# Patient Record
Sex: Male | Born: 1950
Health system: Southern US, Community
[De-identification: ages and names within clinical notes are randomized; demographics above are authoritative.]

---

## 2002-11-21 ENCOUNTER — Encounter: Payer: Self-pay | Admitting: Surgery

## 2002-11-21 ENCOUNTER — Encounter: Admission: RE | Admit: 2002-11-21 | Discharge: 2002-11-21 | Payer: Self-pay | Admitting: Surgery

## 2003-04-29 ENCOUNTER — Encounter: Admission: RE | Admit: 2003-04-29 | Discharge: 2003-04-29 | Payer: Self-pay | Admitting: Surgery

## 2009-07-14 ENCOUNTER — Encounter: Admission: RE | Admit: 2009-07-14 | Discharge: 2009-07-14 | Payer: Self-pay | Admitting: General Surgery

## 2017-01-20 DIAGNOSIS — M9903 Segmental and somatic dysfunction of lumbar region: Secondary | ICD-10-CM | POA: Diagnosis not present

## 2017-01-20 DIAGNOSIS — M9901 Segmental and somatic dysfunction of cervical region: Secondary | ICD-10-CM | POA: Diagnosis not present

## 2017-01-20 DIAGNOSIS — M542 Cervicalgia: Secondary | ICD-10-CM | POA: Diagnosis not present

## 2017-01-20 DIAGNOSIS — M546 Pain in thoracic spine: Secondary | ICD-10-CM | POA: Diagnosis not present

## 2017-01-24 DIAGNOSIS — M546 Pain in thoracic spine: Secondary | ICD-10-CM | POA: Diagnosis not present

## 2017-01-24 DIAGNOSIS — M542 Cervicalgia: Secondary | ICD-10-CM | POA: Diagnosis not present

## 2017-01-24 DIAGNOSIS — M9903 Segmental and somatic dysfunction of lumbar region: Secondary | ICD-10-CM | POA: Diagnosis not present

## 2017-01-24 DIAGNOSIS — M9901 Segmental and somatic dysfunction of cervical region: Secondary | ICD-10-CM | POA: Diagnosis not present

## 2017-04-01 DIAGNOSIS — E7800 Pure hypercholesterolemia, unspecified: Secondary | ICD-10-CM | POA: Diagnosis not present

## 2017-04-01 DIAGNOSIS — R7303 Prediabetes: Secondary | ICD-10-CM | POA: Diagnosis not present

## 2017-04-01 DIAGNOSIS — Z125 Encounter for screening for malignant neoplasm of prostate: Secondary | ICD-10-CM | POA: Diagnosis not present

## 2017-04-01 DIAGNOSIS — I10 Essential (primary) hypertension: Secondary | ICD-10-CM | POA: Diagnosis not present

## 2017-04-08 DIAGNOSIS — Z23 Encounter for immunization: Secondary | ICD-10-CM | POA: Diagnosis not present

## 2017-04-08 DIAGNOSIS — I10 Essential (primary) hypertension: Secondary | ICD-10-CM | POA: Diagnosis not present

## 2017-04-08 DIAGNOSIS — E782 Mixed hyperlipidemia: Secondary | ICD-10-CM | POA: Diagnosis not present

## 2017-04-08 DIAGNOSIS — Z0000 Encounter for general adult medical examination without abnormal findings: Secondary | ICD-10-CM | POA: Diagnosis not present

## 2017-05-12 DIAGNOSIS — D485 Neoplasm of uncertain behavior of skin: Secondary | ICD-10-CM | POA: Diagnosis not present

## 2017-05-12 DIAGNOSIS — D225 Melanocytic nevi of trunk: Secondary | ICD-10-CM | POA: Diagnosis not present

## 2017-05-12 DIAGNOSIS — Z85828 Personal history of other malignant neoplasm of skin: Secondary | ICD-10-CM | POA: Diagnosis not present

## 2017-05-12 DIAGNOSIS — L821 Other seborrheic keratosis: Secondary | ICD-10-CM | POA: Diagnosis not present

## 2017-08-04 DIAGNOSIS — E782 Mixed hyperlipidemia: Secondary | ICD-10-CM | POA: Diagnosis not present

## 2017-08-04 DIAGNOSIS — R7303 Prediabetes: Secondary | ICD-10-CM | POA: Diagnosis not present

## 2017-08-04 DIAGNOSIS — Z0000 Encounter for general adult medical examination without abnormal findings: Secondary | ICD-10-CM | POA: Diagnosis not present

## 2017-08-04 DIAGNOSIS — I10 Essential (primary) hypertension: Secondary | ICD-10-CM | POA: Diagnosis not present

## 2017-08-08 DIAGNOSIS — E782 Mixed hyperlipidemia: Secondary | ICD-10-CM | POA: Diagnosis not present

## 2017-08-08 DIAGNOSIS — E663 Overweight: Secondary | ICD-10-CM | POA: Diagnosis not present

## 2017-08-08 DIAGNOSIS — I10 Essential (primary) hypertension: Secondary | ICD-10-CM | POA: Diagnosis not present

## 2017-08-08 DIAGNOSIS — R7303 Prediabetes: Secondary | ICD-10-CM | POA: Diagnosis not present

## 2017-11-09 DIAGNOSIS — Z85828 Personal history of other malignant neoplasm of skin: Secondary | ICD-10-CM | POA: Diagnosis not present

## 2017-11-09 DIAGNOSIS — D225 Melanocytic nevi of trunk: Secondary | ICD-10-CM | POA: Diagnosis not present

## 2017-11-09 DIAGNOSIS — C44519 Basal cell carcinoma of skin of other part of trunk: Secondary | ICD-10-CM | POA: Diagnosis not present

## 2017-11-09 DIAGNOSIS — L720 Epidermal cyst: Secondary | ICD-10-CM | POA: Diagnosis not present

## 2017-11-09 DIAGNOSIS — D2262 Melanocytic nevi of left upper limb, including shoulder: Secondary | ICD-10-CM | POA: Diagnosis not present

## 2018-01-03 DIAGNOSIS — D72819 Decreased white blood cell count, unspecified: Secondary | ICD-10-CM | POA: Diagnosis not present

## 2018-01-03 DIAGNOSIS — R509 Fever, unspecified: Secondary | ICD-10-CM | POA: Diagnosis not present

## 2018-01-06 DIAGNOSIS — D709 Neutropenia, unspecified: Secondary | ICD-10-CM | POA: Diagnosis not present

## 2018-01-06 DIAGNOSIS — R509 Fever, unspecified: Secondary | ICD-10-CM | POA: Diagnosis not present

## 2018-03-02 DIAGNOSIS — Z23 Encounter for immunization: Secondary | ICD-10-CM | POA: Diagnosis not present

## 2018-03-26 DIAGNOSIS — R197 Diarrhea, unspecified: Secondary | ICD-10-CM | POA: Diagnosis not present

## 2018-03-27 DIAGNOSIS — R197 Diarrhea, unspecified: Secondary | ICD-10-CM | POA: Diagnosis not present

## 2018-04-13 DIAGNOSIS — R7303 Prediabetes: Secondary | ICD-10-CM | POA: Diagnosis not present

## 2018-04-13 DIAGNOSIS — Z8619 Personal history of other infectious and parasitic diseases: Secondary | ICD-10-CM | POA: Diagnosis not present

## 2018-04-13 DIAGNOSIS — Z0000 Encounter for general adult medical examination without abnormal findings: Secondary | ICD-10-CM | POA: Diagnosis not present

## 2018-04-13 DIAGNOSIS — I10 Essential (primary) hypertension: Secondary | ICD-10-CM | POA: Diagnosis not present

## 2018-05-23 DIAGNOSIS — Z85828 Personal history of other malignant neoplasm of skin: Secondary | ICD-10-CM | POA: Diagnosis not present

## 2018-05-23 DIAGNOSIS — C44519 Basal cell carcinoma of skin of other part of trunk: Secondary | ICD-10-CM | POA: Diagnosis not present

## 2018-05-23 DIAGNOSIS — D485 Neoplasm of uncertain behavior of skin: Secondary | ICD-10-CM | POA: Diagnosis not present

## 2018-05-23 DIAGNOSIS — C4441 Basal cell carcinoma of skin of scalp and neck: Secondary | ICD-10-CM | POA: Diagnosis not present

## 2018-05-23 DIAGNOSIS — D225 Melanocytic nevi of trunk: Secondary | ICD-10-CM | POA: Diagnosis not present

## 2018-05-23 DIAGNOSIS — L821 Other seborrheic keratosis: Secondary | ICD-10-CM | POA: Diagnosis not present

## 2018-11-23 DIAGNOSIS — L570 Actinic keratosis: Secondary | ICD-10-CM | POA: Diagnosis not present

## 2018-11-23 DIAGNOSIS — L821 Other seborrheic keratosis: Secondary | ICD-10-CM | POA: Diagnosis not present

## 2018-11-23 DIAGNOSIS — Z85828 Personal history of other malignant neoplasm of skin: Secondary | ICD-10-CM | POA: Diagnosis not present

## 2018-11-23 DIAGNOSIS — D225 Melanocytic nevi of trunk: Secondary | ICD-10-CM | POA: Diagnosis not present

## 2019-02-22 ENCOUNTER — Encounter: Payer: Self-pay | Admitting: Podiatry

## 2019-02-22 ENCOUNTER — Ambulatory Visit (INDEPENDENT_AMBULATORY_CARE_PROVIDER_SITE_OTHER): Payer: Medicare Other

## 2019-02-22 ENCOUNTER — Other Ambulatory Visit: Payer: Self-pay

## 2019-02-22 ENCOUNTER — Ambulatory Visit: Payer: Medicare Other | Admitting: Podiatry

## 2019-02-22 VITALS — BP 131/70 | HR 77 | Resp 16

## 2019-02-22 DIAGNOSIS — M7662 Achilles tendinitis, left leg: Secondary | ICD-10-CM

## 2019-02-22 DIAGNOSIS — M7661 Achilles tendinitis, right leg: Secondary | ICD-10-CM

## 2019-02-22 MED ORDER — METHYLPREDNISOLONE 4 MG PO TBPK: ORAL_TABLET | ORAL | 0 refills | Status: DC

## 2019-02-22 NOTE — Progress Notes (Signed)
  Subjective:  Patient ID: Johnny Edwards, male    DOB: 1950-08-06,  MRN: [id number] HPI Chief Complaint  Patient presents with  . Foot Pain    Posterior and plantar heel bilateral (L>R) - aching x 2 months, same problem 2 years ago, orthopedist told him to wear 2 pair socks to help, so he did-problem has returned, takes naproxen, tried "deep blue" and biofreeze  . New Patient (Initial Visit)    68 y.o. male presents with the above complaint.   ROS: Denies fever chills nausea vomiting muscle aches pains calf pain back pain chest pain shortness of breath  No past medical history on file.   Current Outpatient Medications:  .  amLODipine (NORVASC) 10 MG tablet, , Disp: , Rfl:  .  atorvastatin (LIPITOR) 20 MG tablet, , Disp: , Rfl:  .  methylPREDNISolone (MEDROL DOSEPAK) 4 MG TBPK tablet, 6 day dose pack - take as directed, Disp: 21 tablet, Rfl: 0  No Known Allergies Review of Systems Objective:   Vitals:   02/22/19 1514  BP: 131/70  Pulse: 77  Resp: 16    General: Well developed, nourished, in no acute distress, alert and oriented x3   Dermatological: Skin is warm, dry and supple bilateral. Nails x 10 are well maintained; remaining integument appears unremarkable at this time. There are no open sores, no preulcerative lesions, no rash or signs of infection present.  Vascular: Dorsalis Pedis artery and Posterior Tibial artery pedal pulses are 2/4 bilateral with immedate capillary fill time. Pedal hair growth present. No varicosities and no lower extremity edema present bilateral.   Neruologic: Grossly intact via light touch bilateral. Vibratory intact via tuning fork bilateral. Protective threshold with Semmes Wienstein monofilament intact to all pedal sites bilateral. Patellar and Achilles deep tendon reflexes 2+ bilateral. No Babinski or clonus noted bilateral.   Musculoskeletal: No gross boney pedal deformities bilateral. No pain, crepitus, or limitation noted with foot and  ankle range of motion bilateral. Muscular strength 5/5 in all groups tested bilateral.  Pain on palpation with fluctuance to the lateral aspect of the Achilles Achilles tendon at its insertion site of the calcaneus left greater than right.  There is mild tenderness on palpation of the Achilles itself.  Gait: Unassisted, Nonantalgic.    Radiographs:  Radiographs taken today demonstrate an osseously mature individual with thickening of the Achilles tendon distally.  Also demonstrates retrocalcaneal heel spur with intratendinous calcification.  Plantar distally oriented calcaneal heel spur left also demonstrates some intratendinous calcification with a plantar spur.  Assessment & Plan:   Assessment: At this point after sterile Betadine skin prep I injected 2 mg of dexamethasone and local anesthetic to the point of maximal tenderness injecting into a bursa on the superior lateral aspect of the calcaneus at the insertion site of the Achilles.    Plan: After the injections I will start him on methylprednisolone I will follow-up with him in 1 month.     Max T. [place], [place]

## 2019-04-17 ENCOUNTER — Ambulatory Visit: Payer: Medicare Other | Admitting: Podiatry

## 2019-04-19 DIAGNOSIS — I10 Essential (primary) hypertension: Secondary | ICD-10-CM | POA: Diagnosis not present

## 2019-04-19 DIAGNOSIS — R7303 Prediabetes: Secondary | ICD-10-CM | POA: Diagnosis not present

## 2019-04-19 DIAGNOSIS — E7800 Pure hypercholesterolemia, unspecified: Secondary | ICD-10-CM | POA: Diagnosis not present

## 2019-04-19 DIAGNOSIS — Z0000 Encounter for general adult medical examination without abnormal findings: Secondary | ICD-10-CM | POA: Diagnosis not present

## 2019-04-27 DIAGNOSIS — I10 Essential (primary) hypertension: Secondary | ICD-10-CM | POA: Diagnosis not present

## 2019-04-27 DIAGNOSIS — E7800 Pure hypercholesterolemia, unspecified: Secondary | ICD-10-CM | POA: Diagnosis not present

## 2019-04-27 DIAGNOSIS — R7303 Prediabetes: Secondary | ICD-10-CM | POA: Diagnosis not present

## 2019-04-27 DIAGNOSIS — Z0000 Encounter for general adult medical examination without abnormal findings: Secondary | ICD-10-CM | POA: Diagnosis not present

## 2019-05-01 ENCOUNTER — Ambulatory Visit: Payer: Medicare Other | Admitting: Podiatry

## 2019-05-01 ENCOUNTER — Other Ambulatory Visit: Payer: Self-pay

## 2019-05-01 ENCOUNTER — Encounter: Payer: Self-pay | Admitting: Podiatry

## 2019-05-01 DIAGNOSIS — M7662 Achilles tendinitis, left leg: Secondary | ICD-10-CM

## 2019-05-01 DIAGNOSIS — M7661 Achilles tendinitis, right leg: Secondary | ICD-10-CM

## 2019-05-01 MED ORDER — MELOXICAM 15 MG PO TABS: 15.0000 mg | ORAL_TABLET | Freq: Every day | ORAL | 3 refills | Status: DC

## 2019-05-01 NOTE — Progress Notes (Signed)
He presents today for follow-up of his bilateral Achilles tendinitis he states that he has had these treated in the past with injections to no avail.  He states that he does a lot of standing while at work.  He states that they are no worse and mildly better from September.  Objective: Vital signs are stable he is alert and oriented x3.  Insertional Achilles tendinitis with pain on palpation bilaterally he has mild bursitis which is a feeling of fluctuance beneath the skin of the posterior heel.  Appears to be a retro-Achilles bursitis.  Assessment: Achilles bursitis Achilles tendinitis bilaterally.  Plan: Discussed etiology pathology conservative versus surgical therapies at this point were going to request orthotics to be fabricated he saw Liliane Channel today for those.  Follow-up with him once those come in.

## 2019-05-22 ENCOUNTER — Other Ambulatory Visit: Payer: Medicare Other | Admitting: Orthotics

## 2019-05-28 ENCOUNTER — Other Ambulatory Visit: Payer: Self-pay

## 2019-05-28 ENCOUNTER — Ambulatory Visit: Payer: Medicare Other | Admitting: Orthotics

## 2019-05-28 DIAGNOSIS — M7661 Achilles tendinitis, right leg: Secondary | ICD-10-CM

## 2019-05-28 DIAGNOSIS — M7662 Achilles tendinitis, left leg: Secondary | ICD-10-CM

## 2019-05-28 NOTE — Progress Notes (Signed)
Patient came in today to pick up custom made foot orthotics.  The goals were accomplished and the patient reported no dissatisfaction with said orthotics.  Patient was advised of breakin period and how to report any issues. 

## 2019-05-29 DIAGNOSIS — Z85828 Personal history of other malignant neoplasm of skin: Secondary | ICD-10-CM | POA: Diagnosis not present

## 2019-05-29 DIAGNOSIS — D225 Melanocytic nevi of trunk: Secondary | ICD-10-CM | POA: Diagnosis not present

## 2019-05-29 DIAGNOSIS — D1801 Hemangioma of skin and subcutaneous tissue: Secondary | ICD-10-CM | POA: Diagnosis not present

## 2019-05-29 DIAGNOSIS — L570 Actinic keratosis: Secondary | ICD-10-CM | POA: Diagnosis not present

## 2019-06-14 DIAGNOSIS — I10 Essential (primary) hypertension: Secondary | ICD-10-CM | POA: Diagnosis not present

## 2019-06-14 DIAGNOSIS — R600 Localized edema: Secondary | ICD-10-CM | POA: Diagnosis not present

## 2019-06-28 ENCOUNTER — Other Ambulatory Visit: Payer: Self-pay

## 2019-06-28 ENCOUNTER — Ambulatory Visit: Payer: Medicare Other | Attending: Internal Medicine

## 2019-06-28 ENCOUNTER — Ambulatory Visit: Payer: Medicare Other | Admitting: Podiatry

## 2019-06-28 ENCOUNTER — Encounter: Payer: Self-pay | Admitting: Podiatry

## 2019-06-28 DIAGNOSIS — M7662 Achilles tendinitis, left leg: Secondary | ICD-10-CM

## 2019-06-28 DIAGNOSIS — M7661 Achilles tendinitis, right leg: Secondary | ICD-10-CM

## 2019-06-28 DIAGNOSIS — Z23 Encounter for immunization: Secondary | ICD-10-CM | POA: Insufficient documentation

## 2019-06-28 NOTE — Progress Notes (Signed)
   Covid-19 Vaccination Clinic  Name:  Johnny Edwards    MRN: [id number] DOB: [birth year]  06/28/2019  [name] was observed post Covid-19 immunization for 15 minutes without incidence. He was provided with Vaccine Information Sheet and instruction to access the V-Safe system.   [name] was instructed to call 911 with any severe reactions post vaccine: Marland Kitchen Difficulty breathing  . Swelling of your face and throat  . A fast heartbeat  . A bad rash all over your body  . Dizziness and weakness    Immunizations Administered    Name Date Dose VIS Date Route   Pfizer COVID-19 Vaccine 06/28/2019  6:10 PM 0.3 mL 05/18/2019 Intramuscular   Manufacturer: [employer], [employer]   Lot: [id number]   NDC: [address]-2

## 2019-06-28 NOTE — Progress Notes (Signed)
He presents today for follow-up of his Achilles tendinitis bilaterally states that been using the orthotics and they seem to be working but I still have this area right in here that is mildly tender.  Objective: Vital signs are stable he is alert and oriented x3.  Pulses are palpable.  Neurologic sensorium is intact.  He has pain on palpation distal medial aspect of the Achilles bilaterally with some soft tissue fluctuance consistent with bursitis.  Assessment: Distal Achilles tendinitis medially with bursitis.  Plan: Discussed etiology pathology conservative or surgical therapies at this point time injected 2 mg of dexamethasone and local anesthetic to the area of bursitis bilaterally.  Also recommend he continue the use of the orthotics.  Recommend an anti-inflammatory such as Voltaren gel.  We will follow-up with him in the near future.

## 2019-07-19 ENCOUNTER — Ambulatory Visit: Payer: Medicare Other | Admitting: Orthotics

## 2019-07-19 ENCOUNTER — Other Ambulatory Visit: Payer: Self-pay

## 2019-07-19 ENCOUNTER — Ambulatory Visit: Payer: Medicare Other | Attending: Internal Medicine

## 2019-07-19 DIAGNOSIS — Z23 Encounter for immunization: Secondary | ICD-10-CM | POA: Insufficient documentation

## 2019-07-19 DIAGNOSIS — M7661 Achilles tendinitis, right leg: Secondary | ICD-10-CM

## 2019-07-19 NOTE — Progress Notes (Signed)
   Covid-19 Vaccination Clinic  Name:  Johnny Edwards    MRN: [id number] DOB: 1951-01-04  07/19/2019  [name] was observed post Covid-19 immunization for 15 minutes without incidence. He was provided with Vaccine Information Sheet and instruction to access the V-Safe system.   [name] was instructed to call 911 with any severe reactions post vaccine: Marland Kitchen Difficulty breathing  . Swelling of your face and throat  . A fast heartbeat  . A bad rash all over your body  . Dizziness and weakness    Immunizations Administered    Name Date Dose VIS Date Route   Pfizer COVID-19 Vaccine 07/19/2019  5:18 PM 0.3 mL 05/18/2019 Intramuscular   Manufacturer: [employer], [employer]   Lot: [id number]   NDC: [address]-5

## 2019-07-19 NOTE — Progress Notes (Signed)
Adjusted f/o taking material out of medial heel.

## 2019-08-06 DIAGNOSIS — M9903 Segmental and somatic dysfunction of lumbar region: Secondary | ICD-10-CM | POA: Diagnosis not present

## 2019-08-06 DIAGNOSIS — M9901 Segmental and somatic dysfunction of cervical region: Secondary | ICD-10-CM | POA: Diagnosis not present

## 2019-08-06 DIAGNOSIS — M9904 Segmental and somatic dysfunction of sacral region: Secondary | ICD-10-CM | POA: Diagnosis not present

## 2019-08-06 DIAGNOSIS — M9902 Segmental and somatic dysfunction of thoracic region: Secondary | ICD-10-CM | POA: Diagnosis not present

## 2019-08-08 DIAGNOSIS — M9901 Segmental and somatic dysfunction of cervical region: Secondary | ICD-10-CM | POA: Diagnosis not present

## 2019-08-08 DIAGNOSIS — M9903 Segmental and somatic dysfunction of lumbar region: Secondary | ICD-10-CM | POA: Diagnosis not present

## 2019-08-08 DIAGNOSIS — M9904 Segmental and somatic dysfunction of sacral region: Secondary | ICD-10-CM | POA: Diagnosis not present

## 2019-08-08 DIAGNOSIS — M9902 Segmental and somatic dysfunction of thoracic region: Secondary | ICD-10-CM | POA: Diagnosis not present

## 2019-08-09 DIAGNOSIS — M9903 Segmental and somatic dysfunction of lumbar region: Secondary | ICD-10-CM | POA: Diagnosis not present

## 2019-08-09 DIAGNOSIS — M9904 Segmental and somatic dysfunction of sacral region: Secondary | ICD-10-CM | POA: Diagnosis not present

## 2019-08-09 DIAGNOSIS — M9901 Segmental and somatic dysfunction of cervical region: Secondary | ICD-10-CM | POA: Diagnosis not present

## 2019-08-09 DIAGNOSIS — M9902 Segmental and somatic dysfunction of thoracic region: Secondary | ICD-10-CM | POA: Diagnosis not present

## 2019-08-13 DIAGNOSIS — R350 Frequency of micturition: Secondary | ICD-10-CM | POA: Diagnosis not present

## 2019-08-14 ENCOUNTER — Ambulatory Visit: Payer: Medicare Other | Admitting: Podiatry

## 2019-08-16 DIAGNOSIS — M9903 Segmental and somatic dysfunction of lumbar region: Secondary | ICD-10-CM | POA: Diagnosis not present

## 2019-08-16 DIAGNOSIS — M9904 Segmental and somatic dysfunction of sacral region: Secondary | ICD-10-CM | POA: Diagnosis not present

## 2019-08-16 DIAGNOSIS — M9902 Segmental and somatic dysfunction of thoracic region: Secondary | ICD-10-CM | POA: Diagnosis not present

## 2019-08-16 DIAGNOSIS — M9901 Segmental and somatic dysfunction of cervical region: Secondary | ICD-10-CM | POA: Diagnosis not present

## 2019-08-20 DIAGNOSIS — M9902 Segmental and somatic dysfunction of thoracic region: Secondary | ICD-10-CM | POA: Diagnosis not present

## 2019-08-20 DIAGNOSIS — M9901 Segmental and somatic dysfunction of cervical region: Secondary | ICD-10-CM | POA: Diagnosis not present

## 2019-08-20 DIAGNOSIS — M9904 Segmental and somatic dysfunction of sacral region: Secondary | ICD-10-CM | POA: Diagnosis not present

## 2019-08-20 DIAGNOSIS — M9903 Segmental and somatic dysfunction of lumbar region: Secondary | ICD-10-CM | POA: Diagnosis not present

## 2019-08-23 DIAGNOSIS — M9902 Segmental and somatic dysfunction of thoracic region: Secondary | ICD-10-CM | POA: Diagnosis not present

## 2019-08-23 DIAGNOSIS — M9904 Segmental and somatic dysfunction of sacral region: Secondary | ICD-10-CM | POA: Diagnosis not present

## 2019-08-23 DIAGNOSIS — M9903 Segmental and somatic dysfunction of lumbar region: Secondary | ICD-10-CM | POA: Diagnosis not present

## 2019-08-23 DIAGNOSIS — M9901 Segmental and somatic dysfunction of cervical region: Secondary | ICD-10-CM | POA: Diagnosis not present

## 2019-08-27 DIAGNOSIS — M9904 Segmental and somatic dysfunction of sacral region: Secondary | ICD-10-CM | POA: Diagnosis not present

## 2019-08-27 DIAGNOSIS — M9903 Segmental and somatic dysfunction of lumbar region: Secondary | ICD-10-CM | POA: Diagnosis not present

## 2019-08-27 DIAGNOSIS — M9902 Segmental and somatic dysfunction of thoracic region: Secondary | ICD-10-CM | POA: Diagnosis not present

## 2019-08-27 DIAGNOSIS — M9901 Segmental and somatic dysfunction of cervical region: Secondary | ICD-10-CM | POA: Diagnosis not present

## 2019-08-30 DIAGNOSIS — M9901 Segmental and somatic dysfunction of cervical region: Secondary | ICD-10-CM | POA: Diagnosis not present

## 2019-08-30 DIAGNOSIS — M9902 Segmental and somatic dysfunction of thoracic region: Secondary | ICD-10-CM | POA: Diagnosis not present

## 2019-08-30 DIAGNOSIS — M9903 Segmental and somatic dysfunction of lumbar region: Secondary | ICD-10-CM | POA: Diagnosis not present

## 2019-08-30 DIAGNOSIS — M9904 Segmental and somatic dysfunction of sacral region: Secondary | ICD-10-CM | POA: Diagnosis not present

## 2019-09-03 DIAGNOSIS — M9904 Segmental and somatic dysfunction of sacral region: Secondary | ICD-10-CM | POA: Diagnosis not present

## 2019-09-03 DIAGNOSIS — M9902 Segmental and somatic dysfunction of thoracic region: Secondary | ICD-10-CM | POA: Diagnosis not present

## 2019-09-03 DIAGNOSIS — M9903 Segmental and somatic dysfunction of lumbar region: Secondary | ICD-10-CM | POA: Diagnosis not present

## 2019-09-03 DIAGNOSIS — M9901 Segmental and somatic dysfunction of cervical region: Secondary | ICD-10-CM | POA: Diagnosis not present

## 2019-09-06 DIAGNOSIS — M9901 Segmental and somatic dysfunction of cervical region: Secondary | ICD-10-CM | POA: Diagnosis not present

## 2019-09-06 DIAGNOSIS — M9902 Segmental and somatic dysfunction of thoracic region: Secondary | ICD-10-CM | POA: Diagnosis not present

## 2019-09-06 DIAGNOSIS — M9904 Segmental and somatic dysfunction of sacral region: Secondary | ICD-10-CM | POA: Diagnosis not present

## 2019-09-06 DIAGNOSIS — M9903 Segmental and somatic dysfunction of lumbar region: Secondary | ICD-10-CM | POA: Diagnosis not present

## 2019-09-17 ENCOUNTER — Telehealth: Payer: Self-pay | Admitting: Podiatry

## 2019-09-17 DIAGNOSIS — T148XXA Other injury of unspecified body region, initial encounter: Secondary | ICD-10-CM

## 2019-09-17 DIAGNOSIS — M7662 Achilles tendinitis, left leg: Secondary | ICD-10-CM

## 2019-09-17 DIAGNOSIS — M7661 Achilles tendinitis, right leg: Secondary | ICD-10-CM

## 2019-09-17 NOTE — Telephone Encounter (Signed)
Please review and advise. Thanks.  

## 2019-09-17 NOTE — Telephone Encounter (Signed)
Patient not sure if he needs to come in or if we could just schedule the MRI for him. He said the orthotics are not working.

## 2019-09-18 NOTE — Telephone Encounter (Signed)
Faxed orders to [facility], Enrigue Catena, CMA to pre-cert.

## 2019-09-18 NOTE — Telephone Encounter (Signed)
I spoke to pt and he states the left foot is bothering him the most.

## 2019-09-18 NOTE — Telephone Encounter (Signed)
We can order an MRI of the rear foot for the achilles but you need to make sure of the side.  I think it was the left that bothered him more.  I know both were hurting.  Dx. Achilles tendon tear.

## 2019-09-18 NOTE — Addendum Note (Signed)
Addended by: Alphia Kava D on: 09/18/2019 01:39 PM   Modules accepted: Orders

## 2019-09-18 NOTE — Telephone Encounter (Signed)
MRI order

## 2019-09-19 DIAGNOSIS — M9904 Segmental and somatic dysfunction of sacral region: Secondary | ICD-10-CM | POA: Diagnosis not present

## 2019-09-19 DIAGNOSIS — M9902 Segmental and somatic dysfunction of thoracic region: Secondary | ICD-10-CM | POA: Diagnosis not present

## 2019-09-19 DIAGNOSIS — M9903 Segmental and somatic dysfunction of lumbar region: Secondary | ICD-10-CM | POA: Diagnosis not present

## 2019-09-19 DIAGNOSIS — M9901 Segmental and somatic dysfunction of cervical region: Secondary | ICD-10-CM | POA: Diagnosis not present

## 2019-10-04 DIAGNOSIS — E7800 Pure hypercholesterolemia, unspecified: Secondary | ICD-10-CM | POA: Diagnosis not present

## 2019-10-04 DIAGNOSIS — I10 Essential (primary) hypertension: Secondary | ICD-10-CM | POA: Diagnosis not present

## 2019-10-11 DIAGNOSIS — M9901 Segmental and somatic dysfunction of cervical region: Secondary | ICD-10-CM | POA: Diagnosis not present

## 2019-10-11 DIAGNOSIS — M9904 Segmental and somatic dysfunction of sacral region: Secondary | ICD-10-CM | POA: Diagnosis not present

## 2019-10-11 DIAGNOSIS — M9902 Segmental and somatic dysfunction of thoracic region: Secondary | ICD-10-CM | POA: Diagnosis not present

## 2019-10-11 DIAGNOSIS — M9903 Segmental and somatic dysfunction of lumbar region: Secondary | ICD-10-CM | POA: Diagnosis not present

## 2019-10-23 ENCOUNTER — Other Ambulatory Visit: Payer: Self-pay

## 2019-10-23 ENCOUNTER — Other Ambulatory Visit: Payer: Medicare Other

## 2019-10-23 ENCOUNTER — Ambulatory Visit
Admission: RE | Admit: 2019-10-23 | Discharge: 2019-10-23 | Disposition: A | Discharge status: Home / Self Care | Payer: Medicare Other | Source: Ambulatory Visit | Attending: Podiatry | Admitting: Podiatry

## 2019-10-23 DIAGNOSIS — S86312A Strain of muscle(s) and tendon(s) of peroneal muscle group at lower leg level, left leg, initial encounter: Secondary | ICD-10-CM | POA: Diagnosis not present

## 2019-10-25 ENCOUNTER — Telehealth: Payer: Self-pay | Admitting: *Deleted

## 2019-10-25 NOTE — Telephone Encounter (Signed)
Pt called for results.

## 2019-10-25 NOTE — Telephone Encounter (Signed)
-----   Message from Johnny Edwards, [place] sent at 10/23/2019  3:17 PM EDT ----- Please send for an over read and inform the patient of the delay.  Thanks

## 2019-10-25 NOTE — Telephone Encounter (Signed)
I informed pt of Dr. Geryl Rankins request to send MRI disc copy to radiology specialist for more details for treatment planning, there would be a 10-14 day delay in final results and we would call with instructions. Faxed request for MRI disc to [facility]-Jennifer Sanders.

## 2019-10-29 NOTE — Telephone Encounter (Signed)
Received copy of MRI disc and mailed to [facility]. 

## 2019-11-14 ENCOUNTER — Telehealth: Payer: Self-pay | Admitting: Podiatry

## 2019-11-14 NOTE — Telephone Encounter (Signed)
Hey Val Pt has called wanting an update on the MRI OVERREAD please advise thank you

## 2019-11-15 ENCOUNTER — Telehealth: Payer: Self-pay | Admitting: *Deleted

## 2019-11-15 NOTE — Telephone Encounter (Signed)
[facility] - Charlie states report is waiting Dr. Michaelle Copas signature. Over read received and sent to scan, copy to Dr. Al Corpus.

## 2019-11-15 NOTE — Telephone Encounter (Signed)
Pt called for over read results.

## 2019-11-16 NOTE — Telephone Encounter (Signed)
Left message informing pt Dr. Al Corpus had the over read and was reviewing and I would call with instructions.

## 2019-11-19 ENCOUNTER — Encounter: Payer: Self-pay | Admitting: Podiatry

## 2019-11-20 DIAGNOSIS — M9901 Segmental and somatic dysfunction of cervical region: Secondary | ICD-10-CM | POA: Diagnosis not present

## 2019-11-20 DIAGNOSIS — M9903 Segmental and somatic dysfunction of lumbar region: Secondary | ICD-10-CM | POA: Diagnosis not present

## 2019-11-20 DIAGNOSIS — M9904 Segmental and somatic dysfunction of sacral region: Secondary | ICD-10-CM | POA: Diagnosis not present

## 2019-11-20 DIAGNOSIS — M9902 Segmental and somatic dysfunction of thoracic region: Secondary | ICD-10-CM | POA: Diagnosis not present

## 2019-11-20 NOTE — Telephone Encounter (Signed)
Pt called and states the MRI results have taken 3 weeks and he would like to know what to do.

## 2019-11-20 NOTE — Telephone Encounter (Signed)
I spoke with pt and informed that I could see the results, but could not release them to him at this time, but I could have a scheduler call and get him in to see Dr. Al Corpus to discuss the results and treatment options. Pt agreed.

## 2019-11-21 ENCOUNTER — Ambulatory Visit: Payer: Medicare Other | Admitting: Podiatrist

## 2019-11-21 ENCOUNTER — Other Ambulatory Visit: Payer: Self-pay

## 2019-11-21 DIAGNOSIS — M7661 Achilles tendinitis, right leg: Secondary | ICD-10-CM | POA: Diagnosis not present

## 2019-11-21 DIAGNOSIS — M7662 Achilles tendinitis, left leg: Secondary | ICD-10-CM

## 2019-11-21 NOTE — Progress Notes (Signed)
    Chief Complaint  Patient presents with  . Ankle Pain    MRI Results     HPI: Patient is 69 y.o. male who presents today for MRI results and to discuss options to treat his heel pain.  He relates he has been doing cold laser and has noticed some improvement.  Points to the posterior lateral insertion area of the achilles as the area of pain.  Review of Systems No fevers, chills, nausea, muscle aches, no difficulty breathing, no calf pain, no chest pain or shortness of breath.   Physical Exam  GENERAL APPEARANCE: Alert, conversant. Appropriately groomed. No acute distress.   VASCULAR: Pedal pulses palpable DP and PT bilateral.  Capillary refill time is immediate to all digits,  Proximal to distal cooling it warm to warm.  Digital hair growth is present bilateral   NEUROLOGIC: sensation is intact epicritically and protectively to 5.07 monofilament at 5/5 sites bilateral.  Light touch is intact bilateral, vibratory sensation intact bilateral, achilles tendon reflex is intact bilateral.   MUSCULOSKELETAL: acceptable muscle strength, tone and stability bilateral.  No gross boney pedal deformities noted.    DERMATOLOGIC: skin is warm, supple, and dry.  No open lesions noted.  No rash, no pre ulcerative lesions. Digital nails are asymptomatic.    Pain noted posterior lateral aspect of the left heel at the insertion of the achilles on the calcaneus.  No discreet pain along the peroneus brevis tendon.  No swelling noted.    See MRI full results in imaging section of chart**  MRI: IMPRESSION: Intact Achilles with mild to moderate appearing insertional Achilles tendinopathy. Associated minimal reactive marrow edema in the calcaneus and a small volume of fluid the retrocalcaneal bursa.  Longitudinal split tear of the peroneus brevis as it passes along the calcaneus.   Assessment   insertional achilles tendonitis   Plan  1-Recommended continuing with the cold laser.  Also  discussed he could try tuli heel cups to help cushion the heel where he feels the ridge of the orthotic-  May need to see Raiford Noble for an adjustment as well 2-He will see Dr. Al Corpus at the end of July to discuss further treatment options. 3- we went over MRI report and all questions answered to the best of my ability.

## 2019-11-21 NOTE — Patient Instructions (Signed)
Look up TULI heel cups on amazon-  Read the reviews and see if you think they may be worth a try.  Continue the laser treatments and we will have you in to see Dr. Al Corpus to discuss further.

## 2019-11-22 ENCOUNTER — Telehealth: Payer: Self-pay

## 2019-11-22 ENCOUNTER — Encounter: Payer: Self-pay | Admitting: Podiatrist

## 2019-11-22 DIAGNOSIS — M9903 Segmental and somatic dysfunction of lumbar region: Secondary | ICD-10-CM | POA: Diagnosis not present

## 2019-11-22 DIAGNOSIS — M9901 Segmental and somatic dysfunction of cervical region: Secondary | ICD-10-CM | POA: Diagnosis not present

## 2019-11-22 DIAGNOSIS — M9904 Segmental and somatic dysfunction of sacral region: Secondary | ICD-10-CM | POA: Diagnosis not present

## 2019-11-22 DIAGNOSIS — M9902 Segmental and somatic dysfunction of thoracic region: Secondary | ICD-10-CM | POA: Diagnosis not present

## 2019-11-22 NOTE — Telephone Encounter (Signed)
-----   Message from Elinor Parkinson, [place] sent at 11/22/2019  7:50 AM EDT ----- Tendon fraying.  I will follow up with him at his next visit.  Dr. Bea Laura saw him yesterday.

## 2019-11-22 NOTE — Telephone Encounter (Signed)
Patient notified that Dr. Al Corpus has reviewed MRI and will discuss at his next follow up in July

## 2019-12-05 DIAGNOSIS — C44719 Basal cell carcinoma of skin of left lower limb, including hip: Secondary | ICD-10-CM | POA: Diagnosis not present

## 2019-12-05 DIAGNOSIS — D2262 Melanocytic nevi of left upper limb, including shoulder: Secondary | ICD-10-CM | POA: Diagnosis not present

## 2019-12-05 DIAGNOSIS — Z85828 Personal history of other malignant neoplasm of skin: Secondary | ICD-10-CM | POA: Diagnosis not present

## 2019-12-05 DIAGNOSIS — D225 Melanocytic nevi of trunk: Secondary | ICD-10-CM | POA: Diagnosis not present

## 2019-12-05 DIAGNOSIS — L821 Other seborrheic keratosis: Secondary | ICD-10-CM | POA: Diagnosis not present

## 2019-12-05 DIAGNOSIS — D2261 Melanocytic nevi of right upper limb, including shoulder: Secondary | ICD-10-CM | POA: Diagnosis not present

## 2019-12-05 DIAGNOSIS — C44519 Basal cell carcinoma of skin of other part of trunk: Secondary | ICD-10-CM | POA: Diagnosis not present

## 2019-12-13 DIAGNOSIS — M9903 Segmental and somatic dysfunction of lumbar region: Secondary | ICD-10-CM | POA: Diagnosis not present

## 2019-12-13 DIAGNOSIS — M9902 Segmental and somatic dysfunction of thoracic region: Secondary | ICD-10-CM | POA: Diagnosis not present

## 2019-12-13 DIAGNOSIS — M9901 Segmental and somatic dysfunction of cervical region: Secondary | ICD-10-CM | POA: Diagnosis not present

## 2019-12-13 DIAGNOSIS — M9904 Segmental and somatic dysfunction of sacral region: Secondary | ICD-10-CM | POA: Diagnosis not present

## 2019-12-27 ENCOUNTER — Other Ambulatory Visit: Payer: Self-pay

## 2019-12-27 ENCOUNTER — Ambulatory Visit: Payer: Medicare Other | Admitting: Podiatry

## 2019-12-27 DIAGNOSIS — M7661 Achilles tendinitis, right leg: Secondary | ICD-10-CM | POA: Diagnosis not present

## 2019-12-27 DIAGNOSIS — M7662 Achilles tendinitis, left leg: Secondary | ICD-10-CM | POA: Diagnosis not present

## 2019-12-27 NOTE — Progress Notes (Signed)
He presents today states that he seems to be doing a little bit better over the past several weeks he has been doing little better as he has been seeing a chiropractor and cold laser has been used on his left heel.  He states that it seems to be improving.  He says the pain is about half of what it was.  Objective: Vital signs are stable he is alert oriented x3 he still has tenderness on palpation of the posterior aspect of the bilateral heels left greater than right.  There is mild warmth on palpation some mild fluctuance is noted as well.  MRI did demonstrate intrinsic interstitial fraying of the Achilles tendon as well as the peroneal tendons and the posterior tibial tendon.  The MRI was done before he started cold laser.  Assessment: Achilles tendinitis bilateral posterior tibial tendinitis and peroneal tendinitis.  Plan: Discussed etiology pathology conservative therapies at this point time since the Coblator seems to be working for him I think we should add physical therapy should he start to regress that he knows the surgery is going be imminent he will follow up with me at that time for any discussion.  I also am going to have his orthotics recovered with a neoprene rather than the leather this currently there and to give him a little bit softer of the heel particularly on the right heel where there is some tenderness.

## 2019-12-28 ENCOUNTER — Telehealth: Payer: Self-pay | Admitting: *Deleted

## 2019-12-28 DIAGNOSIS — M7662 Achilles tendinitis, left leg: Secondary | ICD-10-CM

## 2019-12-28 DIAGNOSIS — M7661 Achilles tendinitis, right leg: Secondary | ICD-10-CM

## 2019-12-28 NOTE — Telephone Encounter (Signed)
-----   Message from Kristian Covey, [facility] sent at 12/27/2019 11:39 AM EDT ----- Regarding: PT PT in house [facility] -   Evaluate and treat - achilles tendonitis bilateral   Duration: 3 x week x 4 weeks

## 2019-12-28 NOTE — Telephone Encounter (Signed)
Faxed required form, SnapShot with clinicals and demographics to [facility]. 

## 2020-01-02 DIAGNOSIS — M25672 Stiffness of left ankle, not elsewhere classified: Secondary | ICD-10-CM | POA: Diagnosis not present

## 2020-01-02 DIAGNOSIS — Z85828 Personal history of other malignant neoplasm of skin: Secondary | ICD-10-CM | POA: Diagnosis not present

## 2020-01-02 DIAGNOSIS — L738 Other specified follicular disorders: Secondary | ICD-10-CM | POA: Diagnosis not present

## 2020-01-02 DIAGNOSIS — M25571 Pain in right ankle and joints of right foot: Secondary | ICD-10-CM | POA: Diagnosis not present

## 2020-01-02 DIAGNOSIS — L0889 Other specified local infections of the skin and subcutaneous tissue: Secondary | ICD-10-CM | POA: Diagnosis not present

## 2020-01-02 DIAGNOSIS — M25572 Pain in left ankle and joints of left foot: Secondary | ICD-10-CM | POA: Diagnosis not present

## 2020-01-02 DIAGNOSIS — M25671 Stiffness of right ankle, not elsewhere classified: Secondary | ICD-10-CM | POA: Diagnosis not present

## 2020-01-07 ENCOUNTER — Telehealth: Payer: Self-pay | Admitting: Podiatry

## 2020-01-07 NOTE — Telephone Encounter (Signed)
Pt called checking to see if orthotics that were taken from him last week to change the cover where back.  I explained that the orthotics were sent out to be recovered and that usually takes 2 wks or so and I would call when they come in. Pt stated he will be out of town next week.

## 2020-01-08 DIAGNOSIS — M25672 Stiffness of left ankle, not elsewhere classified: Secondary | ICD-10-CM | POA: Diagnosis not present

## 2020-01-08 DIAGNOSIS — M25572 Pain in left ankle and joints of left foot: Secondary | ICD-10-CM | POA: Diagnosis not present

## 2020-01-08 DIAGNOSIS — M25571 Pain in right ankle and joints of right foot: Secondary | ICD-10-CM | POA: Diagnosis not present

## 2020-01-08 DIAGNOSIS — M25671 Stiffness of right ankle, not elsewhere classified: Secondary | ICD-10-CM | POA: Diagnosis not present

## 2020-01-10 DIAGNOSIS — M9903 Segmental and somatic dysfunction of lumbar region: Secondary | ICD-10-CM | POA: Diagnosis not present

## 2020-01-10 DIAGNOSIS — M25671 Stiffness of right ankle, not elsewhere classified: Secondary | ICD-10-CM | POA: Diagnosis not present

## 2020-01-10 DIAGNOSIS — M9902 Segmental and somatic dysfunction of thoracic region: Secondary | ICD-10-CM | POA: Diagnosis not present

## 2020-01-10 DIAGNOSIS — M25571 Pain in right ankle and joints of right foot: Secondary | ICD-10-CM | POA: Diagnosis not present

## 2020-01-10 DIAGNOSIS — M9901 Segmental and somatic dysfunction of cervical region: Secondary | ICD-10-CM | POA: Diagnosis not present

## 2020-01-10 DIAGNOSIS — M25572 Pain in left ankle and joints of left foot: Secondary | ICD-10-CM | POA: Diagnosis not present

## 2020-01-10 DIAGNOSIS — M9904 Segmental and somatic dysfunction of sacral region: Secondary | ICD-10-CM | POA: Diagnosis not present

## 2020-01-10 DIAGNOSIS — M25672 Stiffness of left ankle, not elsewhere classified: Secondary | ICD-10-CM | POA: Diagnosis not present

## 2020-01-22 DIAGNOSIS — M25672 Stiffness of left ankle, not elsewhere classified: Secondary | ICD-10-CM | POA: Diagnosis not present

## 2020-01-22 DIAGNOSIS — M25671 Stiffness of right ankle, not elsewhere classified: Secondary | ICD-10-CM | POA: Diagnosis not present

## 2020-01-22 DIAGNOSIS — M25572 Pain in left ankle and joints of left foot: Secondary | ICD-10-CM | POA: Diagnosis not present

## 2020-01-22 DIAGNOSIS — M25571 Pain in right ankle and joints of right foot: Secondary | ICD-10-CM | POA: Diagnosis not present

## 2020-01-24 DIAGNOSIS — M25671 Stiffness of right ankle, not elsewhere classified: Secondary | ICD-10-CM | POA: Diagnosis not present

## 2020-01-24 DIAGNOSIS — M25571 Pain in right ankle and joints of right foot: Secondary | ICD-10-CM | POA: Diagnosis not present

## 2020-01-24 DIAGNOSIS — E7800 Pure hypercholesterolemia, unspecified: Secondary | ICD-10-CM | POA: Diagnosis not present

## 2020-01-24 DIAGNOSIS — M25572 Pain in left ankle and joints of left foot: Secondary | ICD-10-CM | POA: Diagnosis not present

## 2020-01-24 DIAGNOSIS — M25672 Stiffness of left ankle, not elsewhere classified: Secondary | ICD-10-CM | POA: Diagnosis not present

## 2020-01-24 DIAGNOSIS — I10 Essential (primary) hypertension: Secondary | ICD-10-CM | POA: Diagnosis not present

## 2020-01-29 DIAGNOSIS — M25672 Stiffness of left ankle, not elsewhere classified: Secondary | ICD-10-CM | POA: Diagnosis not present

## 2020-01-29 DIAGNOSIS — M25571 Pain in right ankle and joints of right foot: Secondary | ICD-10-CM | POA: Diagnosis not present

## 2020-01-29 DIAGNOSIS — M25671 Stiffness of right ankle, not elsewhere classified: Secondary | ICD-10-CM | POA: Diagnosis not present

## 2020-01-29 DIAGNOSIS — M25572 Pain in left ankle and joints of left foot: Secondary | ICD-10-CM | POA: Diagnosis not present

## 2020-01-31 DIAGNOSIS — M25572 Pain in left ankle and joints of left foot: Secondary | ICD-10-CM | POA: Diagnosis not present

## 2020-01-31 DIAGNOSIS — M25671 Stiffness of right ankle, not elsewhere classified: Secondary | ICD-10-CM | POA: Diagnosis not present

## 2020-01-31 DIAGNOSIS — M25672 Stiffness of left ankle, not elsewhere classified: Secondary | ICD-10-CM | POA: Diagnosis not present

## 2020-01-31 DIAGNOSIS — M25571 Pain in right ankle and joints of right foot: Secondary | ICD-10-CM | POA: Diagnosis not present

## 2020-02-05 DIAGNOSIS — M25572 Pain in left ankle and joints of left foot: Secondary | ICD-10-CM | POA: Diagnosis not present

## 2020-02-05 DIAGNOSIS — M25672 Stiffness of left ankle, not elsewhere classified: Secondary | ICD-10-CM | POA: Diagnosis not present

## 2020-02-05 DIAGNOSIS — M25671 Stiffness of right ankle, not elsewhere classified: Secondary | ICD-10-CM | POA: Diagnosis not present

## 2020-02-05 DIAGNOSIS — M25571 Pain in right ankle and joints of right foot: Secondary | ICD-10-CM | POA: Diagnosis not present

## 2020-02-07 DIAGNOSIS — M25572 Pain in left ankle and joints of left foot: Secondary | ICD-10-CM | POA: Diagnosis not present

## 2020-02-07 DIAGNOSIS — M25672 Stiffness of left ankle, not elsewhere classified: Secondary | ICD-10-CM | POA: Diagnosis not present

## 2020-02-07 DIAGNOSIS — M25571 Pain in right ankle and joints of right foot: Secondary | ICD-10-CM | POA: Diagnosis not present

## 2020-02-07 DIAGNOSIS — M25671 Stiffness of right ankle, not elsewhere classified: Secondary | ICD-10-CM | POA: Diagnosis not present

## 2020-02-07 DIAGNOSIS — Z0120 Encounter for dental examination and cleaning without abnormal findings: Secondary | ICD-10-CM | POA: Diagnosis not present

## 2020-02-12 DIAGNOSIS — M25572 Pain in left ankle and joints of left foot: Secondary | ICD-10-CM | POA: Diagnosis not present

## 2020-02-12 DIAGNOSIS — M25671 Stiffness of right ankle, not elsewhere classified: Secondary | ICD-10-CM | POA: Diagnosis not present

## 2020-02-12 DIAGNOSIS — M25672 Stiffness of left ankle, not elsewhere classified: Secondary | ICD-10-CM | POA: Diagnosis not present

## 2020-02-12 DIAGNOSIS — M25571 Pain in right ankle and joints of right foot: Secondary | ICD-10-CM | POA: Diagnosis not present

## 2020-02-14 DIAGNOSIS — M25672 Stiffness of left ankle, not elsewhere classified: Secondary | ICD-10-CM | POA: Diagnosis not present

## 2020-02-14 DIAGNOSIS — M25671 Stiffness of right ankle, not elsewhere classified: Secondary | ICD-10-CM | POA: Diagnosis not present

## 2020-02-14 DIAGNOSIS — M25571 Pain in right ankle and joints of right foot: Secondary | ICD-10-CM | POA: Diagnosis not present

## 2020-02-14 DIAGNOSIS — M25572 Pain in left ankle and joints of left foot: Secondary | ICD-10-CM | POA: Diagnosis not present

## 2020-02-19 DIAGNOSIS — M25572 Pain in left ankle and joints of left foot: Secondary | ICD-10-CM | POA: Diagnosis not present

## 2020-02-19 DIAGNOSIS — M25671 Stiffness of right ankle, not elsewhere classified: Secondary | ICD-10-CM | POA: Diagnosis not present

## 2020-02-19 DIAGNOSIS — M25571 Pain in right ankle and joints of right foot: Secondary | ICD-10-CM | POA: Diagnosis not present

## 2020-02-19 DIAGNOSIS — M25672 Stiffness of left ankle, not elsewhere classified: Secondary | ICD-10-CM | POA: Diagnosis not present

## 2020-02-21 DIAGNOSIS — M25671 Stiffness of right ankle, not elsewhere classified: Secondary | ICD-10-CM | POA: Diagnosis not present

## 2020-02-21 DIAGNOSIS — M25572 Pain in left ankle and joints of left foot: Secondary | ICD-10-CM | POA: Diagnosis not present

## 2020-02-21 DIAGNOSIS — M25672 Stiffness of left ankle, not elsewhere classified: Secondary | ICD-10-CM | POA: Diagnosis not present

## 2020-02-21 DIAGNOSIS — M25571 Pain in right ankle and joints of right foot: Secondary | ICD-10-CM | POA: Diagnosis not present

## 2020-02-26 DIAGNOSIS — M25672 Stiffness of left ankle, not elsewhere classified: Secondary | ICD-10-CM | POA: Diagnosis not present

## 2020-02-26 DIAGNOSIS — M25572 Pain in left ankle and joints of left foot: Secondary | ICD-10-CM | POA: Diagnosis not present

## 2020-02-26 DIAGNOSIS — M25671 Stiffness of right ankle, not elsewhere classified: Secondary | ICD-10-CM | POA: Diagnosis not present

## 2020-02-26 DIAGNOSIS — M25571 Pain in right ankle and joints of right foot: Secondary | ICD-10-CM | POA: Diagnosis not present

## 2020-02-28 DIAGNOSIS — M25572 Pain in left ankle and joints of left foot: Secondary | ICD-10-CM | POA: Diagnosis not present

## 2020-02-28 DIAGNOSIS — M25571 Pain in right ankle and joints of right foot: Secondary | ICD-10-CM | POA: Diagnosis not present

## 2020-02-28 DIAGNOSIS — M25672 Stiffness of left ankle, not elsewhere classified: Secondary | ICD-10-CM | POA: Diagnosis not present

## 2020-02-28 DIAGNOSIS — M25671 Stiffness of right ankle, not elsewhere classified: Secondary | ICD-10-CM | POA: Diagnosis not present

## 2020-03-04 DIAGNOSIS — M25671 Stiffness of right ankle, not elsewhere classified: Secondary | ICD-10-CM | POA: Diagnosis not present

## 2020-03-04 DIAGNOSIS — M25572 Pain in left ankle and joints of left foot: Secondary | ICD-10-CM | POA: Diagnosis not present

## 2020-03-04 DIAGNOSIS — M25672 Stiffness of left ankle, not elsewhere classified: Secondary | ICD-10-CM | POA: Diagnosis not present

## 2020-03-04 DIAGNOSIS — M25571 Pain in right ankle and joints of right foot: Secondary | ICD-10-CM | POA: Diagnosis not present

## 2020-03-06 DIAGNOSIS — M25671 Stiffness of right ankle, not elsewhere classified: Secondary | ICD-10-CM | POA: Diagnosis not present

## 2020-03-06 DIAGNOSIS — M25572 Pain in left ankle and joints of left foot: Secondary | ICD-10-CM | POA: Diagnosis not present

## 2020-03-06 DIAGNOSIS — M25571 Pain in right ankle and joints of right foot: Secondary | ICD-10-CM | POA: Diagnosis not present

## 2020-03-06 DIAGNOSIS — M25672 Stiffness of left ankle, not elsewhere classified: Secondary | ICD-10-CM | POA: Diagnosis not present

## 2020-03-11 DIAGNOSIS — M25671 Stiffness of right ankle, not elsewhere classified: Secondary | ICD-10-CM | POA: Diagnosis not present

## 2020-03-11 DIAGNOSIS — M25571 Pain in right ankle and joints of right foot: Secondary | ICD-10-CM | POA: Diagnosis not present

## 2020-03-11 DIAGNOSIS — M25672 Stiffness of left ankle, not elsewhere classified: Secondary | ICD-10-CM | POA: Diagnosis not present

## 2020-03-11 DIAGNOSIS — M25572 Pain in left ankle and joints of left foot: Secondary | ICD-10-CM | POA: Diagnosis not present

## 2020-03-13 DIAGNOSIS — M25672 Stiffness of left ankle, not elsewhere classified: Secondary | ICD-10-CM | POA: Diagnosis not present

## 2020-03-13 DIAGNOSIS — M25572 Pain in left ankle and joints of left foot: Secondary | ICD-10-CM | POA: Diagnosis not present

## 2020-03-13 DIAGNOSIS — M25571 Pain in right ankle and joints of right foot: Secondary | ICD-10-CM | POA: Diagnosis not present

## 2020-03-13 DIAGNOSIS — M25671 Stiffness of right ankle, not elsewhere classified: Secondary | ICD-10-CM | POA: Diagnosis not present

## 2020-03-20 DIAGNOSIS — M25671 Stiffness of right ankle, not elsewhere classified: Secondary | ICD-10-CM | POA: Diagnosis not present

## 2020-03-20 DIAGNOSIS — M25571 Pain in right ankle and joints of right foot: Secondary | ICD-10-CM | POA: Diagnosis not present

## 2020-03-20 DIAGNOSIS — M25672 Stiffness of left ankle, not elsewhere classified: Secondary | ICD-10-CM | POA: Diagnosis not present

## 2020-03-20 DIAGNOSIS — M25572 Pain in left ankle and joints of left foot: Secondary | ICD-10-CM | POA: Diagnosis not present

## 2020-03-27 DIAGNOSIS — M25572 Pain in left ankle and joints of left foot: Secondary | ICD-10-CM | POA: Diagnosis not present

## 2020-03-27 DIAGNOSIS — M25671 Stiffness of right ankle, not elsewhere classified: Secondary | ICD-10-CM | POA: Diagnosis not present

## 2020-03-27 DIAGNOSIS — M25571 Pain in right ankle and joints of right foot: Secondary | ICD-10-CM | POA: Diagnosis not present

## 2020-03-27 DIAGNOSIS — M25672 Stiffness of left ankle, not elsewhere classified: Secondary | ICD-10-CM | POA: Diagnosis not present

## 2020-03-31 DIAGNOSIS — E7800 Pure hypercholesterolemia, unspecified: Secondary | ICD-10-CM | POA: Diagnosis not present

## 2020-03-31 DIAGNOSIS — I10 Essential (primary) hypertension: Secondary | ICD-10-CM | POA: Diagnosis not present

## 2020-04-03 DIAGNOSIS — M25672 Stiffness of left ankle, not elsewhere classified: Secondary | ICD-10-CM | POA: Diagnosis not present

## 2020-04-03 DIAGNOSIS — M25572 Pain in left ankle and joints of left foot: Secondary | ICD-10-CM | POA: Diagnosis not present

## 2020-04-03 DIAGNOSIS — M25571 Pain in right ankle and joints of right foot: Secondary | ICD-10-CM | POA: Diagnosis not present

## 2020-04-03 DIAGNOSIS — M25671 Stiffness of right ankle, not elsewhere classified: Secondary | ICD-10-CM | POA: Diagnosis not present

## 2020-04-10 DIAGNOSIS — M25571 Pain in right ankle and joints of right foot: Secondary | ICD-10-CM | POA: Diagnosis not present

## 2020-04-10 DIAGNOSIS — M25672 Stiffness of left ankle, not elsewhere classified: Secondary | ICD-10-CM | POA: Diagnosis not present

## 2020-04-10 DIAGNOSIS — M25572 Pain in left ankle and joints of left foot: Secondary | ICD-10-CM | POA: Diagnosis not present

## 2020-04-10 DIAGNOSIS — M25671 Stiffness of right ankle, not elsewhere classified: Secondary | ICD-10-CM | POA: Diagnosis not present

## 2020-04-17 DIAGNOSIS — M25572 Pain in left ankle and joints of left foot: Secondary | ICD-10-CM | POA: Diagnosis not present

## 2020-04-17 DIAGNOSIS — M9904 Segmental and somatic dysfunction of sacral region: Secondary | ICD-10-CM | POA: Diagnosis not present

## 2020-04-17 DIAGNOSIS — M9903 Segmental and somatic dysfunction of lumbar region: Secondary | ICD-10-CM | POA: Diagnosis not present

## 2020-04-17 DIAGNOSIS — M25672 Stiffness of left ankle, not elsewhere classified: Secondary | ICD-10-CM | POA: Diagnosis not present

## 2020-04-17 DIAGNOSIS — M25671 Stiffness of right ankle, not elsewhere classified: Secondary | ICD-10-CM | POA: Diagnosis not present

## 2020-04-17 DIAGNOSIS — M25571 Pain in right ankle and joints of right foot: Secondary | ICD-10-CM | POA: Diagnosis not present

## 2020-04-17 DIAGNOSIS — M9902 Segmental and somatic dysfunction of thoracic region: Secondary | ICD-10-CM | POA: Diagnosis not present

## 2020-04-17 DIAGNOSIS — M9901 Segmental and somatic dysfunction of cervical region: Secondary | ICD-10-CM | POA: Diagnosis not present

## 2020-04-24 DIAGNOSIS — M25572 Pain in left ankle and joints of left foot: Secondary | ICD-10-CM | POA: Diagnosis not present

## 2020-04-24 DIAGNOSIS — M25671 Stiffness of right ankle, not elsewhere classified: Secondary | ICD-10-CM | POA: Diagnosis not present

## 2020-04-24 DIAGNOSIS — M25571 Pain in right ankle and joints of right foot: Secondary | ICD-10-CM | POA: Diagnosis not present

## 2020-04-24 DIAGNOSIS — M25672 Stiffness of left ankle, not elsewhere classified: Secondary | ICD-10-CM | POA: Diagnosis not present

## 2020-04-28 DIAGNOSIS — M9901 Segmental and somatic dysfunction of cervical region: Secondary | ICD-10-CM | POA: Diagnosis not present

## 2020-04-28 DIAGNOSIS — M9903 Segmental and somatic dysfunction of lumbar region: Secondary | ICD-10-CM | POA: Diagnosis not present

## 2020-04-28 DIAGNOSIS — M9904 Segmental and somatic dysfunction of sacral region: Secondary | ICD-10-CM | POA: Diagnosis not present

## 2020-04-28 DIAGNOSIS — M9902 Segmental and somatic dysfunction of thoracic region: Secondary | ICD-10-CM | POA: Diagnosis not present

## 2020-04-29 DIAGNOSIS — M25671 Stiffness of right ankle, not elsewhere classified: Secondary | ICD-10-CM | POA: Diagnosis not present

## 2020-04-29 DIAGNOSIS — M25571 Pain in right ankle and joints of right foot: Secondary | ICD-10-CM | POA: Diagnosis not present

## 2020-04-29 DIAGNOSIS — M25572 Pain in left ankle and joints of left foot: Secondary | ICD-10-CM | POA: Diagnosis not present

## 2020-04-29 DIAGNOSIS — M25672 Stiffness of left ankle, not elsewhere classified: Secondary | ICD-10-CM | POA: Diagnosis not present

## 2020-04-30 DIAGNOSIS — R040 Epistaxis: Secondary | ICD-10-CM | POA: Diagnosis not present

## 2020-04-30 DIAGNOSIS — R0981 Nasal congestion: Secondary | ICD-10-CM | POA: Diagnosis not present

## 2020-05-08 DIAGNOSIS — M25672 Stiffness of left ankle, not elsewhere classified: Secondary | ICD-10-CM | POA: Diagnosis not present

## 2020-05-08 DIAGNOSIS — M25571 Pain in right ankle and joints of right foot: Secondary | ICD-10-CM | POA: Diagnosis not present

## 2020-05-08 DIAGNOSIS — M25572 Pain in left ankle and joints of left foot: Secondary | ICD-10-CM | POA: Diagnosis not present

## 2020-05-08 DIAGNOSIS — M25671 Stiffness of right ankle, not elsewhere classified: Secondary | ICD-10-CM | POA: Diagnosis not present

## 2020-05-12 DIAGNOSIS — M9903 Segmental and somatic dysfunction of lumbar region: Secondary | ICD-10-CM | POA: Diagnosis not present

## 2020-05-12 DIAGNOSIS — M9901 Segmental and somatic dysfunction of cervical region: Secondary | ICD-10-CM | POA: Diagnosis not present

## 2020-05-12 DIAGNOSIS — M9904 Segmental and somatic dysfunction of sacral region: Secondary | ICD-10-CM | POA: Diagnosis not present

## 2020-05-12 DIAGNOSIS — M9902 Segmental and somatic dysfunction of thoracic region: Secondary | ICD-10-CM | POA: Diagnosis not present

## 2020-05-15 DIAGNOSIS — M25571 Pain in right ankle and joints of right foot: Secondary | ICD-10-CM | POA: Diagnosis not present

## 2020-05-15 DIAGNOSIS — M25572 Pain in left ankle and joints of left foot: Secondary | ICD-10-CM | POA: Diagnosis not present

## 2020-05-15 DIAGNOSIS — M25671 Stiffness of right ankle, not elsewhere classified: Secondary | ICD-10-CM | POA: Diagnosis not present

## 2020-05-15 DIAGNOSIS — M25672 Stiffness of left ankle, not elsewhere classified: Secondary | ICD-10-CM | POA: Diagnosis not present

## 2020-05-21 DIAGNOSIS — J329 Chronic sinusitis, unspecified: Secondary | ICD-10-CM | POA: Diagnosis not present

## 2020-05-26 DIAGNOSIS — M9904 Segmental and somatic dysfunction of sacral region: Secondary | ICD-10-CM | POA: Diagnosis not present

## 2020-05-26 DIAGNOSIS — M9902 Segmental and somatic dysfunction of thoracic region: Secondary | ICD-10-CM | POA: Diagnosis not present

## 2020-05-26 DIAGNOSIS — M9903 Segmental and somatic dysfunction of lumbar region: Secondary | ICD-10-CM | POA: Diagnosis not present

## 2020-05-26 DIAGNOSIS — M9901 Segmental and somatic dysfunction of cervical region: Secondary | ICD-10-CM | POA: Diagnosis not present

## 2020-06-05 DIAGNOSIS — M25571 Pain in right ankle and joints of right foot: Secondary | ICD-10-CM | POA: Diagnosis not present

## 2020-06-05 DIAGNOSIS — M25671 Stiffness of right ankle, not elsewhere classified: Secondary | ICD-10-CM | POA: Diagnosis not present

## 2020-06-05 DIAGNOSIS — M25672 Stiffness of left ankle, not elsewhere classified: Secondary | ICD-10-CM | POA: Diagnosis not present

## 2020-06-05 DIAGNOSIS — M25572 Pain in left ankle and joints of left foot: Secondary | ICD-10-CM | POA: Diagnosis not present

## 2020-06-12 DIAGNOSIS — Z85828 Personal history of other malignant neoplasm of skin: Secondary | ICD-10-CM | POA: Diagnosis not present

## 2020-06-12 DIAGNOSIS — D2261 Melanocytic nevi of right upper limb, including shoulder: Secondary | ICD-10-CM | POA: Diagnosis not present

## 2020-06-12 DIAGNOSIS — M9903 Segmental and somatic dysfunction of lumbar region: Secondary | ICD-10-CM | POA: Diagnosis not present

## 2020-06-12 DIAGNOSIS — M9901 Segmental and somatic dysfunction of cervical region: Secondary | ICD-10-CM | POA: Diagnosis not present

## 2020-06-12 DIAGNOSIS — M9904 Segmental and somatic dysfunction of sacral region: Secondary | ICD-10-CM | POA: Diagnosis not present

## 2020-06-12 DIAGNOSIS — D225 Melanocytic nevi of trunk: Secondary | ICD-10-CM | POA: Diagnosis not present

## 2020-06-12 DIAGNOSIS — L821 Other seborrheic keratosis: Secondary | ICD-10-CM | POA: Diagnosis not present

## 2020-06-12 DIAGNOSIS — M9902 Segmental and somatic dysfunction of thoracic region: Secondary | ICD-10-CM | POA: Diagnosis not present

## 2020-06-16 DIAGNOSIS — M9904 Segmental and somatic dysfunction of sacral region: Secondary | ICD-10-CM | POA: Diagnosis not present

## 2020-06-16 DIAGNOSIS — M9903 Segmental and somatic dysfunction of lumbar region: Secondary | ICD-10-CM | POA: Diagnosis not present

## 2020-06-16 DIAGNOSIS — M9901 Segmental and somatic dysfunction of cervical region: Secondary | ICD-10-CM | POA: Diagnosis not present

## 2020-06-16 DIAGNOSIS — M9902 Segmental and somatic dysfunction of thoracic region: Secondary | ICD-10-CM | POA: Diagnosis not present

## 2020-06-19 DIAGNOSIS — M9904 Segmental and somatic dysfunction of sacral region: Secondary | ICD-10-CM | POA: Diagnosis not present

## 2020-06-19 DIAGNOSIS — M9903 Segmental and somatic dysfunction of lumbar region: Secondary | ICD-10-CM | POA: Diagnosis not present

## 2020-06-19 DIAGNOSIS — M9902 Segmental and somatic dysfunction of thoracic region: Secondary | ICD-10-CM | POA: Diagnosis not present

## 2020-06-19 DIAGNOSIS — M9901 Segmental and somatic dysfunction of cervical region: Secondary | ICD-10-CM | POA: Diagnosis not present

## 2020-06-25 DIAGNOSIS — M9902 Segmental and somatic dysfunction of thoracic region: Secondary | ICD-10-CM | POA: Diagnosis not present

## 2020-06-25 DIAGNOSIS — M9904 Segmental and somatic dysfunction of sacral region: Secondary | ICD-10-CM | POA: Diagnosis not present

## 2020-06-25 DIAGNOSIS — M9903 Segmental and somatic dysfunction of lumbar region: Secondary | ICD-10-CM | POA: Diagnosis not present

## 2020-06-25 DIAGNOSIS — M9901 Segmental and somatic dysfunction of cervical region: Secondary | ICD-10-CM | POA: Diagnosis not present

## 2020-06-30 DIAGNOSIS — M9902 Segmental and somatic dysfunction of thoracic region: Secondary | ICD-10-CM | POA: Diagnosis not present

## 2020-06-30 DIAGNOSIS — M9901 Segmental and somatic dysfunction of cervical region: Secondary | ICD-10-CM | POA: Diagnosis not present

## 2020-06-30 DIAGNOSIS — M9903 Segmental and somatic dysfunction of lumbar region: Secondary | ICD-10-CM | POA: Diagnosis not present

## 2020-06-30 DIAGNOSIS — M9904 Segmental and somatic dysfunction of sacral region: Secondary | ICD-10-CM | POA: Diagnosis not present

## 2020-07-03 DIAGNOSIS — M25571 Pain in right ankle and joints of right foot: Secondary | ICD-10-CM | POA: Diagnosis not present

## 2020-07-03 DIAGNOSIS — M25572 Pain in left ankle and joints of left foot: Secondary | ICD-10-CM | POA: Diagnosis not present

## 2020-07-03 DIAGNOSIS — M25671 Stiffness of right ankle, not elsewhere classified: Secondary | ICD-10-CM | POA: Diagnosis not present

## 2020-07-03 DIAGNOSIS — M25672 Stiffness of left ankle, not elsewhere classified: Secondary | ICD-10-CM | POA: Diagnosis not present

## 2020-07-09 DIAGNOSIS — M9904 Segmental and somatic dysfunction of sacral region: Secondary | ICD-10-CM | POA: Diagnosis not present

## 2020-07-09 DIAGNOSIS — M9902 Segmental and somatic dysfunction of thoracic region: Secondary | ICD-10-CM | POA: Diagnosis not present

## 2020-07-09 DIAGNOSIS — M9903 Segmental and somatic dysfunction of lumbar region: Secondary | ICD-10-CM | POA: Diagnosis not present

## 2020-07-09 DIAGNOSIS — M9901 Segmental and somatic dysfunction of cervical region: Secondary | ICD-10-CM | POA: Diagnosis not present

## 2020-07-17 DIAGNOSIS — M25672 Stiffness of left ankle, not elsewhere classified: Secondary | ICD-10-CM | POA: Diagnosis not present

## 2020-07-17 DIAGNOSIS — M25572 Pain in left ankle and joints of left foot: Secondary | ICD-10-CM | POA: Diagnosis not present

## 2020-07-17 DIAGNOSIS — M25571 Pain in right ankle and joints of right foot: Secondary | ICD-10-CM | POA: Diagnosis not present

## 2020-07-17 DIAGNOSIS — M25671 Stiffness of right ankle, not elsewhere classified: Secondary | ICD-10-CM | POA: Diagnosis not present

## 2020-07-21 DIAGNOSIS — M9904 Segmental and somatic dysfunction of sacral region: Secondary | ICD-10-CM | POA: Diagnosis not present

## 2020-07-21 DIAGNOSIS — M9901 Segmental and somatic dysfunction of cervical region: Secondary | ICD-10-CM | POA: Diagnosis not present

## 2020-07-21 DIAGNOSIS — M9903 Segmental and somatic dysfunction of lumbar region: Secondary | ICD-10-CM | POA: Diagnosis not present

## 2020-07-21 DIAGNOSIS — M9902 Segmental and somatic dysfunction of thoracic region: Secondary | ICD-10-CM | POA: Diagnosis not present

## 2020-07-29 DIAGNOSIS — R7303 Prediabetes: Secondary | ICD-10-CM | POA: Diagnosis not present

## 2020-07-29 DIAGNOSIS — I10 Essential (primary) hypertension: Secondary | ICD-10-CM | POA: Diagnosis not present

## 2020-07-29 DIAGNOSIS — Z0000 Encounter for general adult medical examination without abnormal findings: Secondary | ICD-10-CM | POA: Diagnosis not present

## 2020-07-29 DIAGNOSIS — E7800 Pure hypercholesterolemia, unspecified: Secondary | ICD-10-CM | POA: Diagnosis not present

## 2020-07-31 DIAGNOSIS — M25671 Stiffness of right ankle, not elsewhere classified: Secondary | ICD-10-CM | POA: Diagnosis not present

## 2020-07-31 DIAGNOSIS — M25571 Pain in right ankle and joints of right foot: Secondary | ICD-10-CM | POA: Diagnosis not present

## 2020-07-31 DIAGNOSIS — M25572 Pain in left ankle and joints of left foot: Secondary | ICD-10-CM | POA: Diagnosis not present

## 2020-07-31 DIAGNOSIS — M25672 Stiffness of left ankle, not elsewhere classified: Secondary | ICD-10-CM | POA: Diagnosis not present

## 2020-08-12 DIAGNOSIS — E7800 Pure hypercholesterolemia, unspecified: Secondary | ICD-10-CM | POA: Diagnosis not present

## 2020-08-12 DIAGNOSIS — I10 Essential (primary) hypertension: Secondary | ICD-10-CM | POA: Diagnosis not present

## 2020-08-12 DIAGNOSIS — E782 Mixed hyperlipidemia: Secondary | ICD-10-CM | POA: Diagnosis not present

## 2020-08-18 DIAGNOSIS — M9904 Segmental and somatic dysfunction of sacral region: Secondary | ICD-10-CM | POA: Diagnosis not present

## 2020-08-18 DIAGNOSIS — M9903 Segmental and somatic dysfunction of lumbar region: Secondary | ICD-10-CM | POA: Diagnosis not present

## 2020-08-18 DIAGNOSIS — M9902 Segmental and somatic dysfunction of thoracic region: Secondary | ICD-10-CM | POA: Diagnosis not present

## 2020-08-18 DIAGNOSIS — M9901 Segmental and somatic dysfunction of cervical region: Secondary | ICD-10-CM | POA: Diagnosis not present

## 2020-09-15 DIAGNOSIS — M9903 Segmental and somatic dysfunction of lumbar region: Secondary | ICD-10-CM | POA: Diagnosis not present

## 2020-09-15 DIAGNOSIS — M9902 Segmental and somatic dysfunction of thoracic region: Secondary | ICD-10-CM | POA: Diagnosis not present

## 2020-09-15 DIAGNOSIS — M9901 Segmental and somatic dysfunction of cervical region: Secondary | ICD-10-CM | POA: Diagnosis not present

## 2020-09-15 DIAGNOSIS — M9904 Segmental and somatic dysfunction of sacral region: Secondary | ICD-10-CM | POA: Diagnosis not present

## 2020-10-13 DIAGNOSIS — M9901 Segmental and somatic dysfunction of cervical region: Secondary | ICD-10-CM | POA: Diagnosis not present

## 2020-10-13 DIAGNOSIS — M9904 Segmental and somatic dysfunction of sacral region: Secondary | ICD-10-CM | POA: Diagnosis not present

## 2020-10-13 DIAGNOSIS — M9903 Segmental and somatic dysfunction of lumbar region: Secondary | ICD-10-CM | POA: Diagnosis not present

## 2020-10-13 DIAGNOSIS — M9902 Segmental and somatic dysfunction of thoracic region: Secondary | ICD-10-CM | POA: Diagnosis not present

## 2020-10-28 DIAGNOSIS — M542 Cervicalgia: Secondary | ICD-10-CM | POA: Diagnosis not present

## 2020-10-28 DIAGNOSIS — M25511 Pain in right shoulder: Secondary | ICD-10-CM | POA: Diagnosis not present

## 2020-11-04 DIAGNOSIS — M25511 Pain in right shoulder: Secondary | ICD-10-CM | POA: Diagnosis not present

## 2020-11-04 DIAGNOSIS — R531 Weakness: Secondary | ICD-10-CM | POA: Diagnosis not present

## 2020-11-04 DIAGNOSIS — M75102 Unspecified rotator cuff tear or rupture of left shoulder, not specified as traumatic: Secondary | ICD-10-CM | POA: Diagnosis not present

## 2020-11-10 DIAGNOSIS — M9902 Segmental and somatic dysfunction of thoracic region: Secondary | ICD-10-CM | POA: Diagnosis not present

## 2020-11-10 DIAGNOSIS — M9903 Segmental and somatic dysfunction of lumbar region: Secondary | ICD-10-CM | POA: Diagnosis not present

## 2020-11-10 DIAGNOSIS — M9901 Segmental and somatic dysfunction of cervical region: Secondary | ICD-10-CM | POA: Diagnosis not present

## 2020-11-10 DIAGNOSIS — M9904 Segmental and somatic dysfunction of sacral region: Secondary | ICD-10-CM | POA: Diagnosis not present

## 2020-12-15 DIAGNOSIS — M9901 Segmental and somatic dysfunction of cervical region: Secondary | ICD-10-CM | POA: Diagnosis not present

## 2020-12-15 DIAGNOSIS — M9904 Segmental and somatic dysfunction of sacral region: Secondary | ICD-10-CM | POA: Diagnosis not present

## 2020-12-15 DIAGNOSIS — M9902 Segmental and somatic dysfunction of thoracic region: Secondary | ICD-10-CM | POA: Diagnosis not present

## 2020-12-15 DIAGNOSIS — M9903 Segmental and somatic dysfunction of lumbar region: Secondary | ICD-10-CM | POA: Diagnosis not present

## 2020-12-17 DIAGNOSIS — C44519 Basal cell carcinoma of skin of other part of trunk: Secondary | ICD-10-CM | POA: Diagnosis not present

## 2020-12-17 DIAGNOSIS — D225 Melanocytic nevi of trunk: Secondary | ICD-10-CM | POA: Diagnosis not present

## 2020-12-17 DIAGNOSIS — Z85828 Personal history of other malignant neoplasm of skin: Secondary | ICD-10-CM | POA: Diagnosis not present

## 2020-12-17 DIAGNOSIS — D2261 Melanocytic nevi of right upper limb, including shoulder: Secondary | ICD-10-CM | POA: Diagnosis not present

## 2020-12-17 DIAGNOSIS — D2262 Melanocytic nevi of left upper limb, including shoulder: Secondary | ICD-10-CM | POA: Diagnosis not present

## 2021-01-16 DIAGNOSIS — J40 Bronchitis, not specified as acute or chronic: Secondary | ICD-10-CM | POA: Diagnosis not present

## 2021-01-16 DIAGNOSIS — H1031 Unspecified acute conjunctivitis, right eye: Secondary | ICD-10-CM | POA: Diagnosis not present

## 2021-01-16 DIAGNOSIS — J029 Acute pharyngitis, unspecified: Secondary | ICD-10-CM | POA: Diagnosis not present

## 2021-01-19 DIAGNOSIS — M9901 Segmental and somatic dysfunction of cervical region: Secondary | ICD-10-CM | POA: Diagnosis not present

## 2021-01-19 DIAGNOSIS — M9904 Segmental and somatic dysfunction of sacral region: Secondary | ICD-10-CM | POA: Diagnosis not present

## 2021-01-19 DIAGNOSIS — M9902 Segmental and somatic dysfunction of thoracic region: Secondary | ICD-10-CM | POA: Diagnosis not present

## 2021-01-19 DIAGNOSIS — M9903 Segmental and somatic dysfunction of lumbar region: Secondary | ICD-10-CM | POA: Diagnosis not present

## 2021-01-26 DIAGNOSIS — J029 Acute pharyngitis, unspecified: Secondary | ICD-10-CM | POA: Diagnosis not present

## 2021-01-26 DIAGNOSIS — J40 Bronchitis, not specified as acute or chronic: Secondary | ICD-10-CM | POA: Diagnosis not present

## 2021-02-16 DIAGNOSIS — M9904 Segmental and somatic dysfunction of sacral region: Secondary | ICD-10-CM | POA: Diagnosis not present

## 2021-02-16 DIAGNOSIS — M9901 Segmental and somatic dysfunction of cervical region: Secondary | ICD-10-CM | POA: Diagnosis not present

## 2021-02-16 DIAGNOSIS — M9902 Segmental and somatic dysfunction of thoracic region: Secondary | ICD-10-CM | POA: Diagnosis not present

## 2021-02-16 DIAGNOSIS — M9903 Segmental and somatic dysfunction of lumbar region: Secondary | ICD-10-CM | POA: Diagnosis not present

## 2021-02-17 DIAGNOSIS — I10 Essential (primary) hypertension: Secondary | ICD-10-CM | POA: Diagnosis not present

## 2021-02-17 DIAGNOSIS — E782 Mixed hyperlipidemia: Secondary | ICD-10-CM | POA: Diagnosis not present

## 2021-02-17 DIAGNOSIS — E7800 Pure hypercholesterolemia, unspecified: Secondary | ICD-10-CM | POA: Diagnosis not present

## 2021-02-19 DIAGNOSIS — R7303 Prediabetes: Secondary | ICD-10-CM | POA: Diagnosis not present

## 2021-02-19 DIAGNOSIS — E7800 Pure hypercholesterolemia, unspecified: Secondary | ICD-10-CM | POA: Diagnosis not present

## 2021-02-23 DIAGNOSIS — R7303 Prediabetes: Secondary | ICD-10-CM | POA: Diagnosis not present

## 2021-02-23 DIAGNOSIS — Z125 Encounter for screening for malignant neoplasm of prostate: Secondary | ICD-10-CM | POA: Diagnosis not present

## 2021-02-23 DIAGNOSIS — E7800 Pure hypercholesterolemia, unspecified: Secondary | ICD-10-CM | POA: Diagnosis not present

## 2021-03-16 DIAGNOSIS — M9901 Segmental and somatic dysfunction of cervical region: Secondary | ICD-10-CM | POA: Diagnosis not present

## 2021-03-16 DIAGNOSIS — M9904 Segmental and somatic dysfunction of sacral region: Secondary | ICD-10-CM | POA: Diagnosis not present

## 2021-03-16 DIAGNOSIS — M9902 Segmental and somatic dysfunction of thoracic region: Secondary | ICD-10-CM | POA: Diagnosis not present

## 2021-03-16 DIAGNOSIS — M9903 Segmental and somatic dysfunction of lumbar region: Secondary | ICD-10-CM | POA: Diagnosis not present

## 2021-04-13 DIAGNOSIS — M9904 Segmental and somatic dysfunction of sacral region: Secondary | ICD-10-CM | POA: Diagnosis not present

## 2021-04-13 DIAGNOSIS — M9902 Segmental and somatic dysfunction of thoracic region: Secondary | ICD-10-CM | POA: Diagnosis not present

## 2021-04-13 DIAGNOSIS — M9903 Segmental and somatic dysfunction of lumbar region: Secondary | ICD-10-CM | POA: Diagnosis not present

## 2021-04-13 DIAGNOSIS — M9901 Segmental and somatic dysfunction of cervical region: Secondary | ICD-10-CM | POA: Diagnosis not present

## 2021-05-01 DIAGNOSIS — J209 Acute bronchitis, unspecified: Secondary | ICD-10-CM | POA: Diagnosis not present

## 2021-05-11 DIAGNOSIS — M9904 Segmental and somatic dysfunction of sacral region: Secondary | ICD-10-CM | POA: Diagnosis not present

## 2021-05-11 DIAGNOSIS — M9901 Segmental and somatic dysfunction of cervical region: Secondary | ICD-10-CM | POA: Diagnosis not present

## 2021-05-11 DIAGNOSIS — M9902 Segmental and somatic dysfunction of thoracic region: Secondary | ICD-10-CM | POA: Diagnosis not present

## 2021-05-11 DIAGNOSIS — M9903 Segmental and somatic dysfunction of lumbar region: Secondary | ICD-10-CM | POA: Diagnosis not present

## 2021-06-10 DIAGNOSIS — K5730 Diverticulosis of large intestine without perforation or abscess without bleeding: Secondary | ICD-10-CM | POA: Diagnosis not present

## 2021-06-10 DIAGNOSIS — D123 Benign neoplasm of transverse colon: Secondary | ICD-10-CM | POA: Diagnosis not present

## 2021-06-10 DIAGNOSIS — D122 Benign neoplasm of ascending colon: Secondary | ICD-10-CM | POA: Diagnosis not present

## 2021-06-10 DIAGNOSIS — D124 Benign neoplasm of descending colon: Secondary | ICD-10-CM | POA: Diagnosis not present

## 2021-06-10 DIAGNOSIS — K648 Other hemorrhoids: Secondary | ICD-10-CM | POA: Diagnosis not present

## 2021-06-10 DIAGNOSIS — D125 Benign neoplasm of sigmoid colon: Secondary | ICD-10-CM | POA: Diagnosis not present

## 2021-06-10 DIAGNOSIS — Z86010 Personal history of colonic polyps: Secondary | ICD-10-CM | POA: Diagnosis not present

## 2021-06-10 DIAGNOSIS — Z09 Encounter for follow-up examination after completed treatment for conditions other than malignant neoplasm: Secondary | ICD-10-CM | POA: Diagnosis not present

## 2021-06-10 DIAGNOSIS — K644 Residual hemorrhoidal skin tags: Secondary | ICD-10-CM | POA: Diagnosis not present

## 2021-06-15 DIAGNOSIS — M9902 Segmental and somatic dysfunction of thoracic region: Secondary | ICD-10-CM | POA: Diagnosis not present

## 2021-06-15 DIAGNOSIS — M9903 Segmental and somatic dysfunction of lumbar region: Secondary | ICD-10-CM | POA: Diagnosis not present

## 2021-06-15 DIAGNOSIS — D124 Benign neoplasm of descending colon: Secondary | ICD-10-CM | POA: Diagnosis not present

## 2021-06-15 DIAGNOSIS — D123 Benign neoplasm of transverse colon: Secondary | ICD-10-CM | POA: Diagnosis not present

## 2021-06-15 DIAGNOSIS — D125 Benign neoplasm of sigmoid colon: Secondary | ICD-10-CM | POA: Diagnosis not present

## 2021-06-15 DIAGNOSIS — D122 Benign neoplasm of ascending colon: Secondary | ICD-10-CM | POA: Diagnosis not present

## 2021-06-15 DIAGNOSIS — M9901 Segmental and somatic dysfunction of cervical region: Secondary | ICD-10-CM | POA: Diagnosis not present

## 2021-06-15 DIAGNOSIS — M9904 Segmental and somatic dysfunction of sacral region: Secondary | ICD-10-CM | POA: Diagnosis not present

## 2021-06-23 DIAGNOSIS — D225 Melanocytic nevi of trunk: Secondary | ICD-10-CM | POA: Diagnosis not present

## 2021-06-23 DIAGNOSIS — Z85828 Personal history of other malignant neoplasm of skin: Secondary | ICD-10-CM | POA: Diagnosis not present

## 2021-06-23 DIAGNOSIS — L821 Other seborrheic keratosis: Secondary | ICD-10-CM | POA: Diagnosis not present

## 2021-06-23 DIAGNOSIS — L570 Actinic keratosis: Secondary | ICD-10-CM | POA: Diagnosis not present

## 2021-06-29 DIAGNOSIS — I10 Essential (primary) hypertension: Secondary | ICD-10-CM | POA: Diagnosis not present

## 2021-06-29 DIAGNOSIS — E782 Mixed hyperlipidemia: Secondary | ICD-10-CM | POA: Diagnosis not present

## 2021-06-29 DIAGNOSIS — E7800 Pure hypercholesterolemia, unspecified: Secondary | ICD-10-CM | POA: Diagnosis not present

## 2021-07-13 DIAGNOSIS — M9901 Segmental and somatic dysfunction of cervical region: Secondary | ICD-10-CM | POA: Diagnosis not present

## 2021-07-13 DIAGNOSIS — M9904 Segmental and somatic dysfunction of sacral region: Secondary | ICD-10-CM | POA: Diagnosis not present

## 2021-07-13 DIAGNOSIS — M9903 Segmental and somatic dysfunction of lumbar region: Secondary | ICD-10-CM | POA: Diagnosis not present

## 2021-07-13 DIAGNOSIS — M9902 Segmental and somatic dysfunction of thoracic region: Secondary | ICD-10-CM | POA: Diagnosis not present

## 2021-08-05 DIAGNOSIS — R0982 Postnasal drip: Secondary | ICD-10-CM | POA: Diagnosis not present

## 2021-08-05 DIAGNOSIS — R051 Acute cough: Secondary | ICD-10-CM | POA: Diagnosis not present

## 2021-08-05 DIAGNOSIS — Z20822 Contact with and (suspected) exposure to covid-19: Secondary | ICD-10-CM | POA: Diagnosis not present

## 2021-08-05 DIAGNOSIS — R0981 Nasal congestion: Secondary | ICD-10-CM | POA: Diagnosis not present

## 2021-08-10 DIAGNOSIS — M9902 Segmental and somatic dysfunction of thoracic region: Secondary | ICD-10-CM | POA: Diagnosis not present

## 2021-08-10 DIAGNOSIS — M9903 Segmental and somatic dysfunction of lumbar region: Secondary | ICD-10-CM | POA: Diagnosis not present

## 2021-08-10 DIAGNOSIS — M9904 Segmental and somatic dysfunction of sacral region: Secondary | ICD-10-CM | POA: Diagnosis not present

## 2021-08-10 DIAGNOSIS — M9901 Segmental and somatic dysfunction of cervical region: Secondary | ICD-10-CM | POA: Diagnosis not present

## 2021-09-08 DIAGNOSIS — M9902 Segmental and somatic dysfunction of thoracic region: Secondary | ICD-10-CM | POA: Diagnosis not present

## 2021-09-08 DIAGNOSIS — M9903 Segmental and somatic dysfunction of lumbar region: Secondary | ICD-10-CM | POA: Diagnosis not present

## 2021-09-08 DIAGNOSIS — M9901 Segmental and somatic dysfunction of cervical region: Secondary | ICD-10-CM | POA: Diagnosis not present

## 2021-09-08 DIAGNOSIS — M9904 Segmental and somatic dysfunction of sacral region: Secondary | ICD-10-CM | POA: Diagnosis not present

## 2021-09-25 DIAGNOSIS — R7303 Prediabetes: Secondary | ICD-10-CM | POA: Diagnosis not present

## 2021-09-25 DIAGNOSIS — Z0000 Encounter for general adult medical examination without abnormal findings: Secondary | ICD-10-CM | POA: Diagnosis not present

## 2021-09-25 DIAGNOSIS — E7800 Pure hypercholesterolemia, unspecified: Secondary | ICD-10-CM | POA: Diagnosis not present

## 2021-09-25 DIAGNOSIS — Z125 Encounter for screening for malignant neoplasm of prostate: Secondary | ICD-10-CM | POA: Diagnosis not present

## 2021-09-25 DIAGNOSIS — M19042 Primary osteoarthritis, left hand: Secondary | ICD-10-CM | POA: Diagnosis not present

## 2021-10-07 DIAGNOSIS — M9904 Segmental and somatic dysfunction of sacral region: Secondary | ICD-10-CM | POA: Diagnosis not present

## 2021-10-07 DIAGNOSIS — M9903 Segmental and somatic dysfunction of lumbar region: Secondary | ICD-10-CM | POA: Diagnosis not present

## 2021-10-07 DIAGNOSIS — M9901 Segmental and somatic dysfunction of cervical region: Secondary | ICD-10-CM | POA: Diagnosis not present

## 2021-10-07 DIAGNOSIS — M9902 Segmental and somatic dysfunction of thoracic region: Secondary | ICD-10-CM | POA: Diagnosis not present

## 2021-10-27 DIAGNOSIS — D72819 Decreased white blood cell count, unspecified: Secondary | ICD-10-CM | POA: Diagnosis not present

## 2021-11-04 ENCOUNTER — Telehealth: Payer: Self-pay

## 2021-11-04 NOTE — Telephone Encounter (Signed)
NOTES SCANNED TO REFERRAL 

## 2021-11-10 DIAGNOSIS — M9904 Segmental and somatic dysfunction of sacral region: Secondary | ICD-10-CM | POA: Diagnosis not present

## 2021-11-10 DIAGNOSIS — M9901 Segmental and somatic dysfunction of cervical region: Secondary | ICD-10-CM | POA: Diagnosis not present

## 2021-11-10 DIAGNOSIS — M9902 Segmental and somatic dysfunction of thoracic region: Secondary | ICD-10-CM | POA: Diagnosis not present

## 2021-11-10 DIAGNOSIS — M9903 Segmental and somatic dysfunction of lumbar region: Secondary | ICD-10-CM | POA: Diagnosis not present

## 2021-11-11 IMAGING — MR MR ANKLE*L* W/O CM
5 series · 40 of 40 positions shown · non-contrast
Comparison: Plain films left foot 02/22/2019.

CLINICAL DATA: Chronic, intermittent posterior left ankle pain. No
known injury.

EXAM:
MRI OF THE LEFT ANKLE WITHOUT CONTRAST
TECHNIQUE: Multiplanar, multisequence MR imaging of the ankle was performed. No
intravenous contrast was administered.

[Series 4: T2 fat-sat · axial · 3.0mm · 0.53mm/px · z∈[-63,+108]mm · 11 of 44 slices shown (1 of 2)]
[im 1/44]
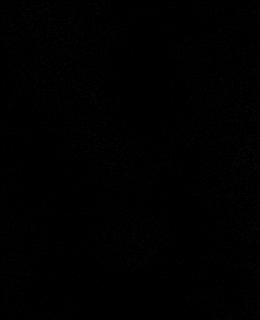
[im 5/44]
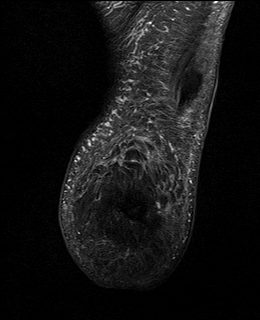
[im 9/44]
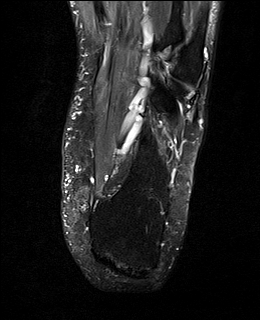
[im 13/44]
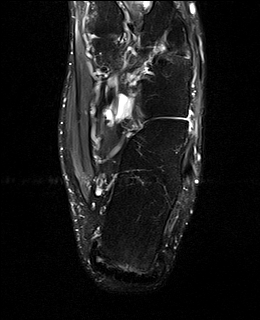
[im 18/44]
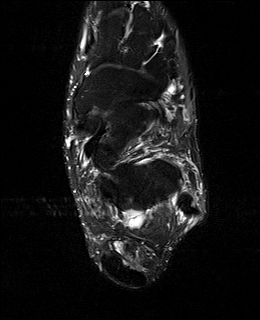
[im 22/44]
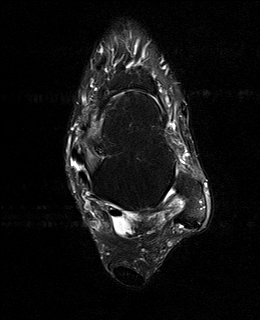
[im 26/44]
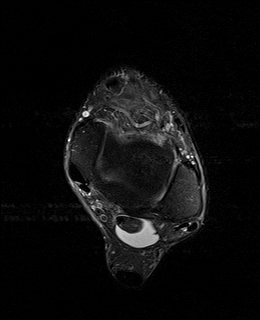
[im 31/44]
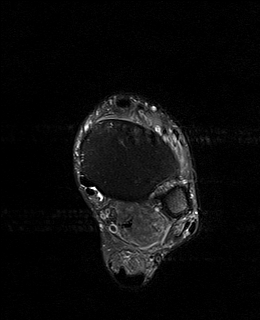
[im 35/44]
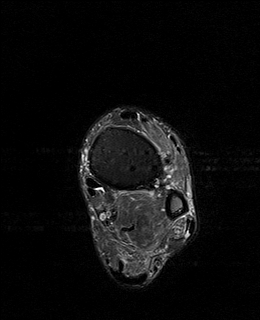
[im 39/44]
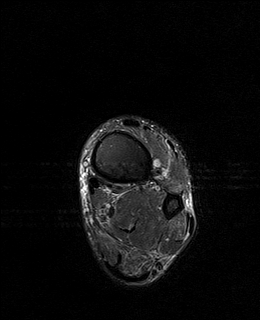
[im 44/44]
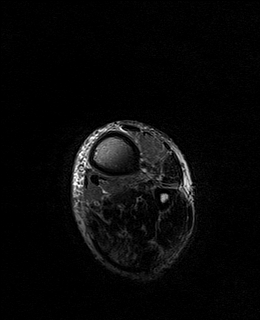

[Series 5: T1 · axial · 3.0mm · 0.53mm/px · z∈[-63,+108]mm · 10 of 44 slices shown (1 of 2)]
[im 1/44]
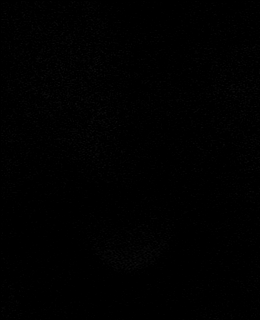
[im 5/44]
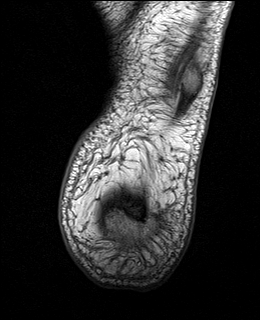
[im 10/44]
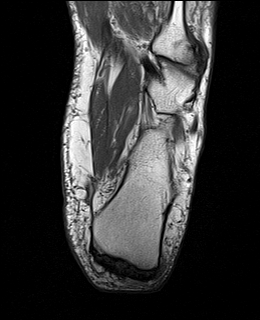
[im 15/44]
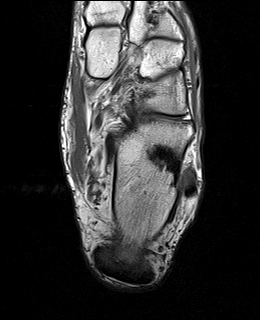
[im 20/44]
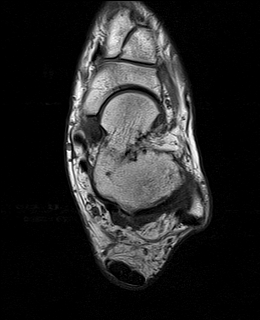
[im 24/44]
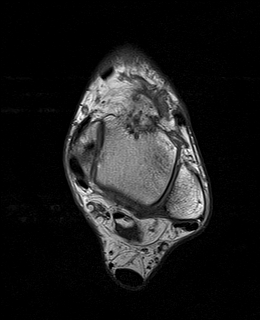
[im 29/44]
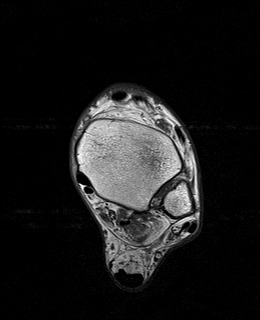
[im 34/44]
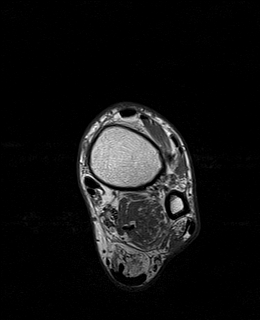
[im 39/44]
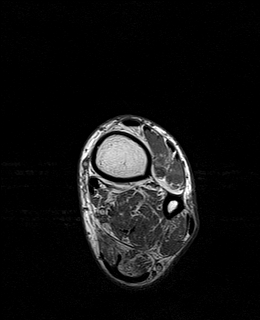
[im 44/44]
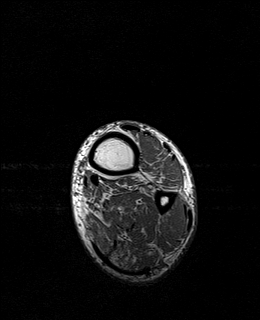

[Series 6: T2 fat-sat · coronal · 3.0mm · 0.66mm/px · 9 of 39 slices shown (2 of 2)]
[im 1/39]
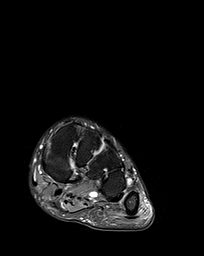
[im 5/39]
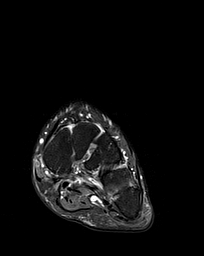
[im 10/39]
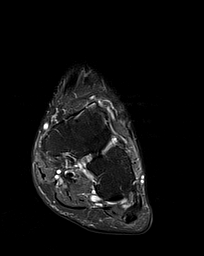
[im 15/39]
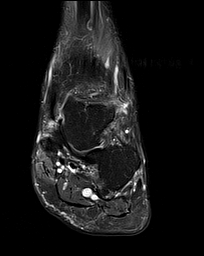
[im 20/39]
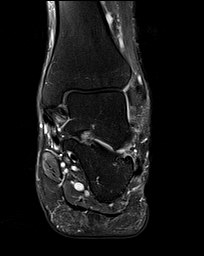
[im 24/39]
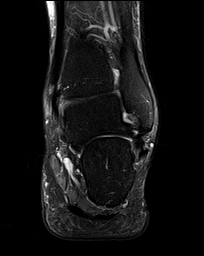
[im 29/39]
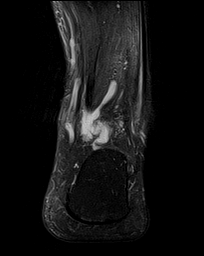
[im 34/39]
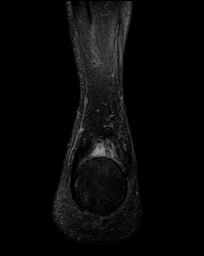
[im 39/39]
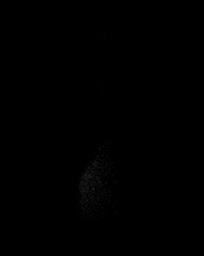

[Series 7: T1 · sagittal · 4.0mm · 0.56mm/px · 5 of 22 slices shown (2 of 2)]
[im 1/22]
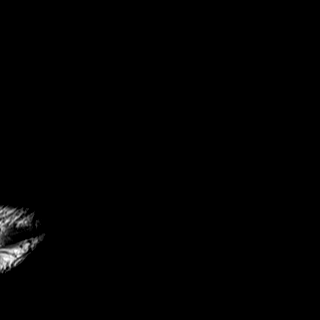
[im 6/22]
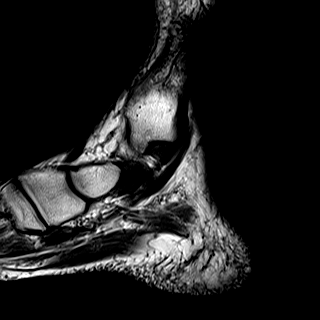
[im 11/22]
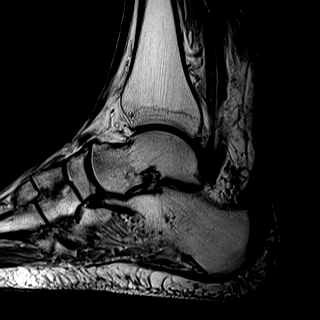
[im 16/22]
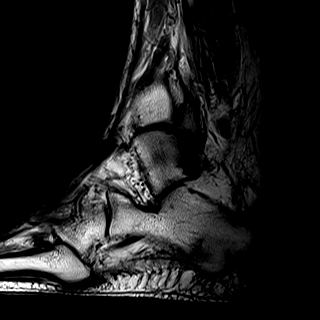
[im 22/22]
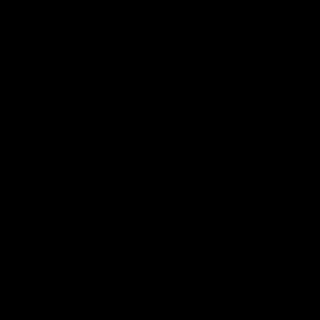

[Series 8: STIR · sagittal · 4.0mm · 0.70mm/px · 5 of 22 slices shown]
[im 1/22]
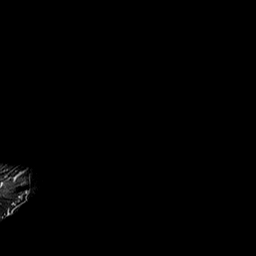
[im 6/22]
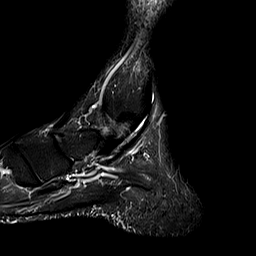
[im 11/22]
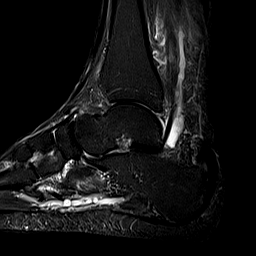
[im 16/22]
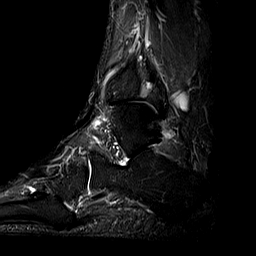
[im 22/22]
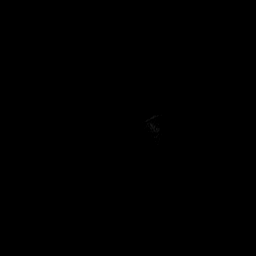

[40 of 40 positions shown; findings below may reference images not displayed]

FINDINGS: TENDONS

Peroneal: There is longitudinal split tearing of the peroneus brevis
from just distal to the lateral malleolus to just proximal to the
calcaneocuboid joint. The peroneus longus is intact.

Posteromedial: Intact.

Anterior: Intact.

Achilles: Intact. Mild thickening and intermediate increased T2
signal are seen in the distal Achilles tendon. Small focus of very
mild reactive marrow edema in the superolateral angle of the
calcaneus is identified and there is some fluid in the
retrocalcaneal bursa.

Plantar Fascia: Intact with normal signal.

LIGAMENTS

Lateral: Intact.

Medial: Intact.

CARTILAGE

Ankle Joint: No osteochondral lesion or joint effusion.

Subtalar Joints/Sinus Tarsi: Normal.

Bones: No fracture, stress change or worrisome lesion.

Other: None
IMPRESSION: Intact Achilles with mild to moderate appearing insertional Achilles
tendinopathy. Associated minimal reactive marrow edema in the
calcaneus and a small volume of fluid the retrocalcaneal bursa.

Longitudinal split tear of the peroneus brevis as it passes along
the calcaneus.

## 2021-11-23 ENCOUNTER — Encounter: Payer: Self-pay | Admitting: Internal Medicine

## 2021-11-23 ENCOUNTER — Ambulatory Visit: Payer: Medicare Other | Admitting: Internal Medicine

## 2021-11-23 VITALS — BP 120/80 | HR 75 | Ht 68.0 in | Wt 194.8 lb

## 2021-11-23 DIAGNOSIS — R011 Cardiac murmur, unspecified: Secondary | ICD-10-CM

## 2021-11-23 NOTE — Patient Instructions (Signed)
Medication Instructions:  Your physician recommends that you continue on your current medications as directed. Please refer to the Current Medication list given to you today.  Testing/Procedures: Your physician has requested that you have an echocardiogram. Echocardiography is a painless test that uses sound waves to create images of your heart. It provides your doctor with information about the size and shape of your heart and how well your heart's chambers and valves are working. This procedure takes approximately one hour. There are no restrictions for this procedure.  This will be done at our [facility] location:  [address]  Follow-Up: At [facility], you and your health needs are our priority.  As part of our continuing mission to provide you with exceptional heart care, we have created designated Provider Care Teams.  These Care Teams include your primary Cardiologist (physician) and Advanced Practice Providers (APPs -  Physician Assistants and Nurse Practitioners) who all work together to provide you with the care you need, when you need it.  We recommend signing up for the patient portal called "MyChart".  Sign up information is provided on this After Visit Summary.  MyChart is used to connect with patients for Virtual Visits (Telemedicine).  Patients are able to view lab/test results, encounter notes, upcoming appointments, etc.  Non-urgent messages can be sent to your provider as well.   To learn more about what you can do with MyChart, go to [email or web address].    Your next appointment:   AS NEEDED with Dr. Wyline Mood   Important Information About Sugar

## 2021-11-23 NOTE — Progress Notes (Signed)
Cardiology Office Note:    Date:  11/23/2021   ID:  Johnny Edwards, DOB 05-27-1951, MRN [id number]  PCP:  Ileana Ladd, MD   [facility] Providers Cardiologist:  None     Referring MD: Ileana Ladd, MD   No chief complaint on file. Murmur  History of Present Illness:    Johnny Edwards is a 71 y.o. male with a hx of HLD, HTN, s/p basal cell cancer, preDM2, referral from Dr. Leodis Sias for murmur c/f aortic stenosis. He notes initially a murmur was heard during a colonscopy  He denies syncope, LH or dizziness. He has no CP or SOB.  No family hx of heart disease  No cardiac history   His blood pressure is normal on norvasc.   He is otherwise active and healthy   Current Medications: Current Meds  Medication Sig   amLODipine (NORVASC) 10 MG tablet Take 10 mg by mouth daily.   Ascorbic Acid (VITAMIN C) 1000 MG tablet SMARTSIG:1 By Mouth   atorvastatin (LIPITOR) 20 MG tablet Take 20 mg by mouth daily.   Docusate Sodium (DSS) 100 MG CAPS Take 100 mg by mouth as needed.   Multiple Vitamin (MULTI-VITAMIN) tablet Take 1 tablet by mouth daily.   naproxen (NAPROSYN) 250 MG tablet Take 250 mg by mouth 2 (two) times daily with a meal. As needed     Allergies:   Patient has no known allergies.   Social History   Socioeconomic History   Marital status: Married    Spouse name: Not on file   Number of children: Not on file   Years of education: Not on file   Highest education level: Not on file  Occupational History   Not on file  Tobacco Use   Smoking status: Never   Smokeless tobacco: Never  Substance and Sexual Activity   Alcohol use: Yes   Drug use: Not on file   Sexual activity: Not on file  Other Topics Concern   Not on file  Social History Narrative   Not on file   Social Determinants of Health   Financial Resource Strain: Not on file  Food Insecurity: Not on file  Transportation Needs: Not on file  Physical Activity: Not on file  Stress: Not  on file  Social Connections: Not on file     Family History: Father had hx of Parkinson's Disease Mother is deceased with hx of breath cancer No premature CAD family hx  ROS:   Please see the history of present illness.     All other systems reviewed and are negative.  EKGs/Labs/Other Studies Reviewed:    The following studies were reviewed today:   EKG:  EKG is  ordered today.  The ekg ordered today demonstrates   6/18-NSR  Recent Labs: No results found for requested labs within last 365 days.  Recent Lipid Panel No results found for: "CHOL", "TRIG", "HDL", "CHOLHDL", "VLDL", "LDLCALC", "LDLDIRECT"   Risk Assessment/Calculations:           Physical Exam:    VS:    Vitals:   11/23/21 1033  BP: 120/80  Pulse: 75  SpO2: 96%     Wt Readings from Last 3 Encounters:  11/23/21 194 lb 12.8 oz (88.4 kg)     GEN:  Well nourished, well developed in no acute distress HEENT: Normal NECK: No JVD; No carotid bruits LYMPHATICS: No lymphadenopathy CARDIAC: RRR, SEM RUSB, no rubs, gallops RESPIRATORY:  Clear to auscultation without rales,  wheezing or rhonchi  ABDOMEN: Soft, non-tender, non-distended MUSCULOSKELETAL:  No edema; No deformity  SKIN: Warm and dry NEUROLOGIC:  Alert and oriented x 3 PSYCHIATRIC:  Normal affect   ASSESSMENT:    #Murmur: has SEM RUSB consistent with possible AS. Typically this does not need further work up without symptoms or signs of severe AS. Since referred from his PCP, will obtain an echo.   PLAN:    In order of problems listed above:  TTE           Medication Adjustments/Labs and Tests Ordered: Current medicines are reviewed at length with the patient today.  Concerns regarding medicines are outlined above.  Orders Placed This Encounter  Procedures   EKG 12-Lead   ECHOCARDIOGRAM COMPLETE   No orders of the defined types were placed in this encounter.   Patient Instructions  Medication Instructions:  Your physician  recommends that you continue on your current medications as directed. Please refer to the Current Medication list given to you today.  Testing/Procedures: Your physician has requested that you have an echocardiogram. Echocardiography is a painless test that uses sound waves to create images of your heart. It provides your doctor with information about the size and shape of your heart and how well your heart's chambers and valves are working. This procedure takes approximately one hour. There are no restrictions for this procedure.  This will be done at our [facility] location:  [address]  Follow-Up: At [facility], you and your health needs are our priority.  As part of our continuing mission to provide you with exceptional heart care, we have created designated Provider Care Teams.  These Care Teams include your primary Cardiologist (physician) and Advanced Practice Providers (APPs -  Physician Assistants and Nurse Practitioners) who all work together to provide you with the care you need, when you need it.  We recommend signing up for the patient portal called "MyChart".  Sign up information is provided on this After Visit Summary.  MyChart is used to connect with patients for Virtual Visits (Telemedicine).  Patients are able to view lab/test results, encounter notes, upcoming appointments, etc.  Non-urgent messages can be sent to your provider as well.   To learn more about what you can do with MyChart, go to [email or web address].    Your next appointment:   AS NEEDED with Dr. Wyline Mood   Important Information About Sugar         Signed, Maisie Fus, MD  11/23/2021 10:51 AM     [facility]

## 2021-11-24 DIAGNOSIS — M9903 Segmental and somatic dysfunction of lumbar region: Secondary | ICD-10-CM | POA: Diagnosis not present

## 2021-11-24 DIAGNOSIS — M9902 Segmental and somatic dysfunction of thoracic region: Secondary | ICD-10-CM | POA: Diagnosis not present

## 2021-11-24 DIAGNOSIS — M9901 Segmental and somatic dysfunction of cervical region: Secondary | ICD-10-CM | POA: Diagnosis not present

## 2021-11-24 DIAGNOSIS — M9904 Segmental and somatic dysfunction of sacral region: Secondary | ICD-10-CM | POA: Diagnosis not present

## 2021-11-25 DIAGNOSIS — M9902 Segmental and somatic dysfunction of thoracic region: Secondary | ICD-10-CM | POA: Diagnosis not present

## 2021-11-25 DIAGNOSIS — M9901 Segmental and somatic dysfunction of cervical region: Secondary | ICD-10-CM | POA: Diagnosis not present

## 2021-11-25 DIAGNOSIS — M9904 Segmental and somatic dysfunction of sacral region: Secondary | ICD-10-CM | POA: Diagnosis not present

## 2021-11-25 DIAGNOSIS — M9903 Segmental and somatic dysfunction of lumbar region: Secondary | ICD-10-CM | POA: Diagnosis not present

## 2021-11-30 DIAGNOSIS — M9904 Segmental and somatic dysfunction of sacral region: Secondary | ICD-10-CM | POA: Diagnosis not present

## 2021-11-30 DIAGNOSIS — M9902 Segmental and somatic dysfunction of thoracic region: Secondary | ICD-10-CM | POA: Diagnosis not present

## 2021-11-30 DIAGNOSIS — M9901 Segmental and somatic dysfunction of cervical region: Secondary | ICD-10-CM | POA: Diagnosis not present

## 2021-11-30 DIAGNOSIS — M9903 Segmental and somatic dysfunction of lumbar region: Secondary | ICD-10-CM | POA: Diagnosis not present

## 2021-12-01 DIAGNOSIS — M9901 Segmental and somatic dysfunction of cervical region: Secondary | ICD-10-CM | POA: Diagnosis not present

## 2021-12-01 DIAGNOSIS — M9904 Segmental and somatic dysfunction of sacral region: Secondary | ICD-10-CM | POA: Diagnosis not present

## 2021-12-01 DIAGNOSIS — M9903 Segmental and somatic dysfunction of lumbar region: Secondary | ICD-10-CM | POA: Diagnosis not present

## 2021-12-01 DIAGNOSIS — M9902 Segmental and somatic dysfunction of thoracic region: Secondary | ICD-10-CM | POA: Diagnosis not present

## 2021-12-02 ENCOUNTER — Ambulatory Visit (HOSPITAL_COMMUNITY): Payer: Medicare Other | Attending: Cardiology

## 2021-12-02 DIAGNOSIS — M9903 Segmental and somatic dysfunction of lumbar region: Secondary | ICD-10-CM | POA: Diagnosis not present

## 2021-12-02 DIAGNOSIS — R011 Cardiac murmur, unspecified: Secondary | ICD-10-CM

## 2021-12-02 DIAGNOSIS — M9901 Segmental and somatic dysfunction of cervical region: Secondary | ICD-10-CM | POA: Diagnosis not present

## 2021-12-02 DIAGNOSIS — M9902 Segmental and somatic dysfunction of thoracic region: Secondary | ICD-10-CM | POA: Diagnosis not present

## 2021-12-02 DIAGNOSIS — M9904 Segmental and somatic dysfunction of sacral region: Secondary | ICD-10-CM | POA: Diagnosis not present

## 2021-12-02 LAB — ECHOCARDIOGRAM COMPLETE
Area-P 1/2: 3.2 cm2
S' Lateral: 2.8 cm

## 2021-12-07 DIAGNOSIS — M9902 Segmental and somatic dysfunction of thoracic region: Secondary | ICD-10-CM | POA: Diagnosis not present

## 2021-12-07 DIAGNOSIS — M9901 Segmental and somatic dysfunction of cervical region: Secondary | ICD-10-CM | POA: Diagnosis not present

## 2021-12-07 DIAGNOSIS — M9904 Segmental and somatic dysfunction of sacral region: Secondary | ICD-10-CM | POA: Diagnosis not present

## 2021-12-07 DIAGNOSIS — M9903 Segmental and somatic dysfunction of lumbar region: Secondary | ICD-10-CM | POA: Diagnosis not present

## 2021-12-09 DIAGNOSIS — M9903 Segmental and somatic dysfunction of lumbar region: Secondary | ICD-10-CM | POA: Diagnosis not present

## 2021-12-09 DIAGNOSIS — M9902 Segmental and somatic dysfunction of thoracic region: Secondary | ICD-10-CM | POA: Diagnosis not present

## 2021-12-09 DIAGNOSIS — M9904 Segmental and somatic dysfunction of sacral region: Secondary | ICD-10-CM | POA: Diagnosis not present

## 2021-12-09 DIAGNOSIS — M9901 Segmental and somatic dysfunction of cervical region: Secondary | ICD-10-CM | POA: Diagnosis not present

## 2021-12-10 DIAGNOSIS — M9904 Segmental and somatic dysfunction of sacral region: Secondary | ICD-10-CM | POA: Diagnosis not present

## 2021-12-10 DIAGNOSIS — M9901 Segmental and somatic dysfunction of cervical region: Secondary | ICD-10-CM | POA: Diagnosis not present

## 2021-12-10 DIAGNOSIS — M9902 Segmental and somatic dysfunction of thoracic region: Secondary | ICD-10-CM | POA: Diagnosis not present

## 2021-12-10 DIAGNOSIS — M9903 Segmental and somatic dysfunction of lumbar region: Secondary | ICD-10-CM | POA: Diagnosis not present

## 2021-12-14 DIAGNOSIS — M9901 Segmental and somatic dysfunction of cervical region: Secondary | ICD-10-CM | POA: Diagnosis not present

## 2021-12-14 DIAGNOSIS — M9902 Segmental and somatic dysfunction of thoracic region: Secondary | ICD-10-CM | POA: Diagnosis not present

## 2021-12-14 DIAGNOSIS — M9904 Segmental and somatic dysfunction of sacral region: Secondary | ICD-10-CM | POA: Diagnosis not present

## 2021-12-14 DIAGNOSIS — M9903 Segmental and somatic dysfunction of lumbar region: Secondary | ICD-10-CM | POA: Diagnosis not present

## 2021-12-16 DIAGNOSIS — G8929 Other chronic pain: Secondary | ICD-10-CM | POA: Diagnosis not present

## 2021-12-16 DIAGNOSIS — M545 Low back pain, unspecified: Secondary | ICD-10-CM | POA: Diagnosis not present

## 2021-12-16 DIAGNOSIS — M9904 Segmental and somatic dysfunction of sacral region: Secondary | ICD-10-CM | POA: Diagnosis not present

## 2021-12-16 DIAGNOSIS — M9902 Segmental and somatic dysfunction of thoracic region: Secondary | ICD-10-CM | POA: Diagnosis not present

## 2021-12-16 DIAGNOSIS — M9903 Segmental and somatic dysfunction of lumbar region: Secondary | ICD-10-CM | POA: Diagnosis not present

## 2021-12-16 DIAGNOSIS — M9901 Segmental and somatic dysfunction of cervical region: Secondary | ICD-10-CM | POA: Diagnosis not present

## 2021-12-22 DIAGNOSIS — L821 Other seborrheic keratosis: Secondary | ICD-10-CM | POA: Diagnosis not present

## 2021-12-22 DIAGNOSIS — Z85828 Personal history of other malignant neoplasm of skin: Secondary | ICD-10-CM | POA: Diagnosis not present

## 2021-12-22 DIAGNOSIS — L570 Actinic keratosis: Secondary | ICD-10-CM | POA: Diagnosis not present

## 2021-12-22 DIAGNOSIS — D225 Melanocytic nevi of trunk: Secondary | ICD-10-CM | POA: Diagnosis not present

## 2021-12-23 DIAGNOSIS — M2569 Stiffness of other specified joint, not elsewhere classified: Secondary | ICD-10-CM | POA: Diagnosis not present

## 2021-12-23 DIAGNOSIS — M5489 Other dorsalgia: Secondary | ICD-10-CM | POA: Diagnosis not present

## 2021-12-23 DIAGNOSIS — R293 Abnormal posture: Secondary | ICD-10-CM | POA: Diagnosis not present

## 2021-12-23 DIAGNOSIS — M47816 Spondylosis without myelopathy or radiculopathy, lumbar region: Secondary | ICD-10-CM | POA: Diagnosis not present

## 2021-12-24 DIAGNOSIS — R293 Abnormal posture: Secondary | ICD-10-CM | POA: Diagnosis not present

## 2021-12-24 DIAGNOSIS — M47816 Spondylosis without myelopathy or radiculopathy, lumbar region: Secondary | ICD-10-CM | POA: Diagnosis not present

## 2021-12-24 DIAGNOSIS — M5489 Other dorsalgia: Secondary | ICD-10-CM | POA: Diagnosis not present

## 2021-12-24 DIAGNOSIS — M2569 Stiffness of other specified joint, not elsewhere classified: Secondary | ICD-10-CM | POA: Diagnosis not present

## 2021-12-28 DIAGNOSIS — M2569 Stiffness of other specified joint, not elsewhere classified: Secondary | ICD-10-CM | POA: Diagnosis not present

## 2021-12-28 DIAGNOSIS — M47816 Spondylosis without myelopathy or radiculopathy, lumbar region: Secondary | ICD-10-CM | POA: Diagnosis not present

## 2021-12-28 DIAGNOSIS — R293 Abnormal posture: Secondary | ICD-10-CM | POA: Diagnosis not present

## 2021-12-28 DIAGNOSIS — M5489 Other dorsalgia: Secondary | ICD-10-CM | POA: Diagnosis not present

## 2021-12-31 DIAGNOSIS — M2569 Stiffness of other specified joint, not elsewhere classified: Secondary | ICD-10-CM | POA: Diagnosis not present

## 2021-12-31 DIAGNOSIS — R293 Abnormal posture: Secondary | ICD-10-CM | POA: Diagnosis not present

## 2021-12-31 DIAGNOSIS — M5489 Other dorsalgia: Secondary | ICD-10-CM | POA: Diagnosis not present

## 2021-12-31 DIAGNOSIS — M47816 Spondylosis without myelopathy or radiculopathy, lumbar region: Secondary | ICD-10-CM | POA: Diagnosis not present

## 2022-01-04 DIAGNOSIS — M2569 Stiffness of other specified joint, not elsewhere classified: Secondary | ICD-10-CM | POA: Diagnosis not present

## 2022-01-04 DIAGNOSIS — R293 Abnormal posture: Secondary | ICD-10-CM | POA: Diagnosis not present

## 2022-01-04 DIAGNOSIS — M5489 Other dorsalgia: Secondary | ICD-10-CM | POA: Diagnosis not present

## 2022-01-04 DIAGNOSIS — M47816 Spondylosis without myelopathy or radiculopathy, lumbar region: Secondary | ICD-10-CM | POA: Diagnosis not present

## 2022-01-07 DIAGNOSIS — R293 Abnormal posture: Secondary | ICD-10-CM | POA: Diagnosis not present

## 2022-01-07 DIAGNOSIS — M2569 Stiffness of other specified joint, not elsewhere classified: Secondary | ICD-10-CM | POA: Diagnosis not present

## 2022-01-07 DIAGNOSIS — M5489 Other dorsalgia: Secondary | ICD-10-CM | POA: Diagnosis not present

## 2022-01-07 DIAGNOSIS — M47816 Spondylosis without myelopathy or radiculopathy, lumbar region: Secondary | ICD-10-CM | POA: Diagnosis not present

## 2022-01-18 DIAGNOSIS — M2569 Stiffness of other specified joint, not elsewhere classified: Secondary | ICD-10-CM | POA: Diagnosis not present

## 2022-01-18 DIAGNOSIS — R293 Abnormal posture: Secondary | ICD-10-CM | POA: Diagnosis not present

## 2022-01-18 DIAGNOSIS — M5489 Other dorsalgia: Secondary | ICD-10-CM | POA: Diagnosis not present

## 2022-01-18 DIAGNOSIS — M47816 Spondylosis without myelopathy or radiculopathy, lumbar region: Secondary | ICD-10-CM | POA: Diagnosis not present

## 2022-01-21 DIAGNOSIS — M2569 Stiffness of other specified joint, not elsewhere classified: Secondary | ICD-10-CM | POA: Diagnosis not present

## 2022-01-21 DIAGNOSIS — R293 Abnormal posture: Secondary | ICD-10-CM | POA: Diagnosis not present

## 2022-01-21 DIAGNOSIS — M47816 Spondylosis without myelopathy or radiculopathy, lumbar region: Secondary | ICD-10-CM | POA: Diagnosis not present

## 2022-01-21 DIAGNOSIS — M5489 Other dorsalgia: Secondary | ICD-10-CM | POA: Diagnosis not present

## 2022-01-26 DIAGNOSIS — M47816 Spondylosis without myelopathy or radiculopathy, lumbar region: Secondary | ICD-10-CM | POA: Diagnosis not present

## 2022-01-26 DIAGNOSIS — R293 Abnormal posture: Secondary | ICD-10-CM | POA: Diagnosis not present

## 2022-01-26 DIAGNOSIS — M5489 Other dorsalgia: Secondary | ICD-10-CM | POA: Diagnosis not present

## 2022-01-26 DIAGNOSIS — M2569 Stiffness of other specified joint, not elsewhere classified: Secondary | ICD-10-CM | POA: Diagnosis not present

## 2022-01-28 DIAGNOSIS — M5489 Other dorsalgia: Secondary | ICD-10-CM | POA: Diagnosis not present

## 2022-01-28 DIAGNOSIS — R293 Abnormal posture: Secondary | ICD-10-CM | POA: Diagnosis not present

## 2022-01-28 DIAGNOSIS — M47816 Spondylosis without myelopathy or radiculopathy, lumbar region: Secondary | ICD-10-CM | POA: Diagnosis not present

## 2022-01-28 DIAGNOSIS — M2569 Stiffness of other specified joint, not elsewhere classified: Secondary | ICD-10-CM | POA: Diagnosis not present

## 2022-02-01 DIAGNOSIS — R293 Abnormal posture: Secondary | ICD-10-CM | POA: Diagnosis not present

## 2022-02-01 DIAGNOSIS — M47816 Spondylosis without myelopathy or radiculopathy, lumbar region: Secondary | ICD-10-CM | POA: Diagnosis not present

## 2022-02-01 DIAGNOSIS — M2569 Stiffness of other specified joint, not elsewhere classified: Secondary | ICD-10-CM | POA: Diagnosis not present

## 2022-02-01 DIAGNOSIS — M5489 Other dorsalgia: Secondary | ICD-10-CM | POA: Diagnosis not present

## 2022-02-04 DIAGNOSIS — M5489 Other dorsalgia: Secondary | ICD-10-CM | POA: Diagnosis not present

## 2022-02-04 DIAGNOSIS — M2569 Stiffness of other specified joint, not elsewhere classified: Secondary | ICD-10-CM | POA: Diagnosis not present

## 2022-02-04 DIAGNOSIS — M47816 Spondylosis without myelopathy or radiculopathy, lumbar region: Secondary | ICD-10-CM | POA: Diagnosis not present

## 2022-02-04 DIAGNOSIS — R293 Abnormal posture: Secondary | ICD-10-CM | POA: Diagnosis not present

## 2022-02-15 DIAGNOSIS — M47816 Spondylosis without myelopathy or radiculopathy, lumbar region: Secondary | ICD-10-CM | POA: Diagnosis not present

## 2022-02-15 DIAGNOSIS — M5489 Other dorsalgia: Secondary | ICD-10-CM | POA: Diagnosis not present

## 2022-02-15 DIAGNOSIS — M2569 Stiffness of other specified joint, not elsewhere classified: Secondary | ICD-10-CM | POA: Diagnosis not present

## 2022-02-15 DIAGNOSIS — R293 Abnormal posture: Secondary | ICD-10-CM | POA: Diagnosis not present

## 2022-02-18 DIAGNOSIS — M47816 Spondylosis without myelopathy or radiculopathy, lumbar region: Secondary | ICD-10-CM | POA: Diagnosis not present

## 2022-02-18 DIAGNOSIS — M2569 Stiffness of other specified joint, not elsewhere classified: Secondary | ICD-10-CM | POA: Diagnosis not present

## 2022-02-18 DIAGNOSIS — M5489 Other dorsalgia: Secondary | ICD-10-CM | POA: Diagnosis not present

## 2022-02-18 DIAGNOSIS — R293 Abnormal posture: Secondary | ICD-10-CM | POA: Diagnosis not present

## 2022-02-22 DIAGNOSIS — M47816 Spondylosis without myelopathy or radiculopathy, lumbar region: Secondary | ICD-10-CM | POA: Diagnosis not present

## 2022-02-22 DIAGNOSIS — R293 Abnormal posture: Secondary | ICD-10-CM | POA: Diagnosis not present

## 2022-02-22 DIAGNOSIS — M2569 Stiffness of other specified joint, not elsewhere classified: Secondary | ICD-10-CM | POA: Diagnosis not present

## 2022-02-22 DIAGNOSIS — M5489 Other dorsalgia: Secondary | ICD-10-CM | POA: Diagnosis not present

## 2022-02-25 DIAGNOSIS — M2569 Stiffness of other specified joint, not elsewhere classified: Secondary | ICD-10-CM | POA: Diagnosis not present

## 2022-02-25 DIAGNOSIS — R293 Abnormal posture: Secondary | ICD-10-CM | POA: Diagnosis not present

## 2022-02-25 DIAGNOSIS — M47816 Spondylosis without myelopathy or radiculopathy, lumbar region: Secondary | ICD-10-CM | POA: Diagnosis not present

## 2022-02-25 DIAGNOSIS — M5489 Other dorsalgia: Secondary | ICD-10-CM | POA: Diagnosis not present

## 2022-03-01 DIAGNOSIS — M5489 Other dorsalgia: Secondary | ICD-10-CM | POA: Diagnosis not present

## 2022-03-01 DIAGNOSIS — R293 Abnormal posture: Secondary | ICD-10-CM | POA: Diagnosis not present

## 2022-03-01 DIAGNOSIS — M2569 Stiffness of other specified joint, not elsewhere classified: Secondary | ICD-10-CM | POA: Diagnosis not present

## 2022-03-01 DIAGNOSIS — M47816 Spondylosis without myelopathy or radiculopathy, lumbar region: Secondary | ICD-10-CM | POA: Diagnosis not present

## 2022-03-04 DIAGNOSIS — M5489 Other dorsalgia: Secondary | ICD-10-CM | POA: Diagnosis not present

## 2022-03-04 DIAGNOSIS — M47816 Spondylosis without myelopathy or radiculopathy, lumbar region: Secondary | ICD-10-CM | POA: Diagnosis not present

## 2022-03-04 DIAGNOSIS — R293 Abnormal posture: Secondary | ICD-10-CM | POA: Diagnosis not present

## 2022-03-04 DIAGNOSIS — M2569 Stiffness of other specified joint, not elsewhere classified: Secondary | ICD-10-CM | POA: Diagnosis not present

## 2022-06-01 DIAGNOSIS — B353 Tinea pedis: Secondary | ICD-10-CM | POA: Diagnosis not present

## 2022-06-01 DIAGNOSIS — Z85828 Personal history of other malignant neoplasm of skin: Secondary | ICD-10-CM | POA: Diagnosis not present

## 2022-06-30 DIAGNOSIS — D2261 Melanocytic nevi of right upper limb, including shoulder: Secondary | ICD-10-CM | POA: Diagnosis not present

## 2022-06-30 DIAGNOSIS — D2272 Melanocytic nevi of left lower limb, including hip: Secondary | ICD-10-CM | POA: Diagnosis not present

## 2022-06-30 DIAGNOSIS — Z85828 Personal history of other malignant neoplasm of skin: Secondary | ICD-10-CM | POA: Diagnosis not present

## 2022-06-30 DIAGNOSIS — D225 Melanocytic nevi of trunk: Secondary | ICD-10-CM | POA: Diagnosis not present

## 2022-06-30 DIAGNOSIS — C4441 Basal cell carcinoma of skin of scalp and neck: Secondary | ICD-10-CM | POA: Diagnosis not present

## 2022-06-30 DIAGNOSIS — L570 Actinic keratosis: Secondary | ICD-10-CM | POA: Diagnosis not present

## 2022-07-06 DIAGNOSIS — M9903 Segmental and somatic dysfunction of lumbar region: Secondary | ICD-10-CM | POA: Diagnosis not present

## 2022-07-06 DIAGNOSIS — M9901 Segmental and somatic dysfunction of cervical region: Secondary | ICD-10-CM | POA: Diagnosis not present

## 2022-07-06 DIAGNOSIS — M9904 Segmental and somatic dysfunction of sacral region: Secondary | ICD-10-CM | POA: Diagnosis not present

## 2022-07-06 DIAGNOSIS — M9902 Segmental and somatic dysfunction of thoracic region: Secondary | ICD-10-CM | POA: Diagnosis not present

## 2022-07-07 DIAGNOSIS — M9902 Segmental and somatic dysfunction of thoracic region: Secondary | ICD-10-CM | POA: Diagnosis not present

## 2022-07-07 DIAGNOSIS — M9903 Segmental and somatic dysfunction of lumbar region: Secondary | ICD-10-CM | POA: Diagnosis not present

## 2022-07-07 DIAGNOSIS — M9904 Segmental and somatic dysfunction of sacral region: Secondary | ICD-10-CM | POA: Diagnosis not present

## 2022-07-07 DIAGNOSIS — M9901 Segmental and somatic dysfunction of cervical region: Secondary | ICD-10-CM | POA: Diagnosis not present

## 2022-07-12 DIAGNOSIS — M9903 Segmental and somatic dysfunction of lumbar region: Secondary | ICD-10-CM | POA: Diagnosis not present

## 2022-07-12 DIAGNOSIS — M9902 Segmental and somatic dysfunction of thoracic region: Secondary | ICD-10-CM | POA: Diagnosis not present

## 2022-07-12 DIAGNOSIS — M9904 Segmental and somatic dysfunction of sacral region: Secondary | ICD-10-CM | POA: Diagnosis not present

## 2022-07-12 DIAGNOSIS — M9901 Segmental and somatic dysfunction of cervical region: Secondary | ICD-10-CM | POA: Diagnosis not present

## 2022-07-14 DIAGNOSIS — M9901 Segmental and somatic dysfunction of cervical region: Secondary | ICD-10-CM | POA: Diagnosis not present

## 2022-07-14 DIAGNOSIS — M9904 Segmental and somatic dysfunction of sacral region: Secondary | ICD-10-CM | POA: Diagnosis not present

## 2022-07-14 DIAGNOSIS — M9902 Segmental and somatic dysfunction of thoracic region: Secondary | ICD-10-CM | POA: Diagnosis not present

## 2022-07-14 DIAGNOSIS — M9903 Segmental and somatic dysfunction of lumbar region: Secondary | ICD-10-CM | POA: Diagnosis not present

## 2022-07-16 DIAGNOSIS — J019 Acute sinusitis, unspecified: Secondary | ICD-10-CM | POA: Diagnosis not present

## 2022-07-19 DIAGNOSIS — M9904 Segmental and somatic dysfunction of sacral region: Secondary | ICD-10-CM | POA: Diagnosis not present

## 2022-07-19 DIAGNOSIS — M9902 Segmental and somatic dysfunction of thoracic region: Secondary | ICD-10-CM | POA: Diagnosis not present

## 2022-07-19 DIAGNOSIS — M9903 Segmental and somatic dysfunction of lumbar region: Secondary | ICD-10-CM | POA: Diagnosis not present

## 2022-07-19 DIAGNOSIS — M9901 Segmental and somatic dysfunction of cervical region: Secondary | ICD-10-CM | POA: Diagnosis not present

## 2022-07-21 DIAGNOSIS — M9904 Segmental and somatic dysfunction of sacral region: Secondary | ICD-10-CM | POA: Diagnosis not present

## 2022-07-21 DIAGNOSIS — M9901 Segmental and somatic dysfunction of cervical region: Secondary | ICD-10-CM | POA: Diagnosis not present

## 2022-07-21 DIAGNOSIS — M9902 Segmental and somatic dysfunction of thoracic region: Secondary | ICD-10-CM | POA: Diagnosis not present

## 2022-07-21 DIAGNOSIS — M9903 Segmental and somatic dysfunction of lumbar region: Secondary | ICD-10-CM | POA: Diagnosis not present

## 2022-07-27 DIAGNOSIS — R293 Abnormal posture: Secondary | ICD-10-CM | POA: Diagnosis not present

## 2022-07-27 DIAGNOSIS — M5489 Other dorsalgia: Secondary | ICD-10-CM | POA: Diagnosis not present

## 2022-07-27 DIAGNOSIS — M2569 Stiffness of other specified joint, not elsewhere classified: Secondary | ICD-10-CM | POA: Diagnosis not present

## 2022-07-27 DIAGNOSIS — M6281 Muscle weakness (generalized): Secondary | ICD-10-CM | POA: Diagnosis not present

## 2022-07-29 DIAGNOSIS — M6281 Muscle weakness (generalized): Secondary | ICD-10-CM | POA: Diagnosis not present

## 2022-07-29 DIAGNOSIS — M5489 Other dorsalgia: Secondary | ICD-10-CM | POA: Diagnosis not present

## 2022-07-29 DIAGNOSIS — R293 Abnormal posture: Secondary | ICD-10-CM | POA: Diagnosis not present

## 2022-07-29 DIAGNOSIS — M2569 Stiffness of other specified joint, not elsewhere classified: Secondary | ICD-10-CM | POA: Diagnosis not present

## 2022-08-03 DIAGNOSIS — M6281 Muscle weakness (generalized): Secondary | ICD-10-CM | POA: Diagnosis not present

## 2022-08-03 DIAGNOSIS — M2569 Stiffness of other specified joint, not elsewhere classified: Secondary | ICD-10-CM | POA: Diagnosis not present

## 2022-08-03 DIAGNOSIS — R293 Abnormal posture: Secondary | ICD-10-CM | POA: Diagnosis not present

## 2022-08-03 DIAGNOSIS — M5489 Other dorsalgia: Secondary | ICD-10-CM | POA: Diagnosis not present

## 2022-08-05 DIAGNOSIS — M6281 Muscle weakness (generalized): Secondary | ICD-10-CM | POA: Diagnosis not present

## 2022-08-05 DIAGNOSIS — M2569 Stiffness of other specified joint, not elsewhere classified: Secondary | ICD-10-CM | POA: Diagnosis not present

## 2022-08-05 DIAGNOSIS — R293 Abnormal posture: Secondary | ICD-10-CM | POA: Diagnosis not present

## 2022-08-05 DIAGNOSIS — M5489 Other dorsalgia: Secondary | ICD-10-CM | POA: Diagnosis not present

## 2022-08-10 DIAGNOSIS — M6281 Muscle weakness (generalized): Secondary | ICD-10-CM | POA: Diagnosis not present

## 2022-08-10 DIAGNOSIS — R293 Abnormal posture: Secondary | ICD-10-CM | POA: Diagnosis not present

## 2022-08-10 DIAGNOSIS — M5489 Other dorsalgia: Secondary | ICD-10-CM | POA: Diagnosis not present

## 2022-08-10 DIAGNOSIS — M2569 Stiffness of other specified joint, not elsewhere classified: Secondary | ICD-10-CM | POA: Diagnosis not present

## 2022-08-13 DIAGNOSIS — M5489 Other dorsalgia: Secondary | ICD-10-CM | POA: Diagnosis not present

## 2022-08-13 DIAGNOSIS — M6281 Muscle weakness (generalized): Secondary | ICD-10-CM | POA: Diagnosis not present

## 2022-08-13 DIAGNOSIS — R293 Abnormal posture: Secondary | ICD-10-CM | POA: Diagnosis not present

## 2022-08-13 DIAGNOSIS — M2569 Stiffness of other specified joint, not elsewhere classified: Secondary | ICD-10-CM | POA: Diagnosis not present

## 2022-08-17 DIAGNOSIS — M6281 Muscle weakness (generalized): Secondary | ICD-10-CM | POA: Diagnosis not present

## 2022-08-17 DIAGNOSIS — M5489 Other dorsalgia: Secondary | ICD-10-CM | POA: Diagnosis not present

## 2022-08-17 DIAGNOSIS — R293 Abnormal posture: Secondary | ICD-10-CM | POA: Diagnosis not present

## 2022-08-17 DIAGNOSIS — M2569 Stiffness of other specified joint, not elsewhere classified: Secondary | ICD-10-CM | POA: Diagnosis not present

## 2022-08-19 DIAGNOSIS — M545 Low back pain, unspecified: Secondary | ICD-10-CM | POA: Diagnosis not present

## 2022-08-20 DIAGNOSIS — M2569 Stiffness of other specified joint, not elsewhere classified: Secondary | ICD-10-CM | POA: Diagnosis not present

## 2022-08-20 DIAGNOSIS — M5489 Other dorsalgia: Secondary | ICD-10-CM | POA: Diagnosis not present

## 2022-08-20 DIAGNOSIS — R293 Abnormal posture: Secondary | ICD-10-CM | POA: Diagnosis not present

## 2022-08-20 DIAGNOSIS — M6281 Muscle weakness (generalized): Secondary | ICD-10-CM | POA: Diagnosis not present

## 2022-08-27 DIAGNOSIS — M545 Low back pain, unspecified: Secondary | ICD-10-CM | POA: Diagnosis not present

## 2022-08-31 DIAGNOSIS — M545 Low back pain, unspecified: Secondary | ICD-10-CM | POA: Diagnosis not present

## 2022-09-02 DIAGNOSIS — M2569 Stiffness of other specified joint, not elsewhere classified: Secondary | ICD-10-CM | POA: Diagnosis not present

## 2022-09-02 DIAGNOSIS — M5489 Other dorsalgia: Secondary | ICD-10-CM | POA: Diagnosis not present

## 2022-09-02 DIAGNOSIS — R293 Abnormal posture: Secondary | ICD-10-CM | POA: Diagnosis not present

## 2022-09-02 DIAGNOSIS — M6281 Muscle weakness (generalized): Secondary | ICD-10-CM | POA: Diagnosis not present

## 2022-09-07 DIAGNOSIS — M5489 Other dorsalgia: Secondary | ICD-10-CM | POA: Diagnosis not present

## 2022-09-07 DIAGNOSIS — M6281 Muscle weakness (generalized): Secondary | ICD-10-CM | POA: Diagnosis not present

## 2022-09-07 DIAGNOSIS — R293 Abnormal posture: Secondary | ICD-10-CM | POA: Diagnosis not present

## 2022-09-07 DIAGNOSIS — M2569 Stiffness of other specified joint, not elsewhere classified: Secondary | ICD-10-CM | POA: Diagnosis not present

## 2022-09-10 DIAGNOSIS — M5489 Other dorsalgia: Secondary | ICD-10-CM | POA: Diagnosis not present

## 2022-09-10 DIAGNOSIS — R293 Abnormal posture: Secondary | ICD-10-CM | POA: Diagnosis not present

## 2022-09-10 DIAGNOSIS — M6281 Muscle weakness (generalized): Secondary | ICD-10-CM | POA: Diagnosis not present

## 2022-09-10 DIAGNOSIS — M2569 Stiffness of other specified joint, not elsewhere classified: Secondary | ICD-10-CM | POA: Diagnosis not present

## 2022-09-17 DIAGNOSIS — R293 Abnormal posture: Secondary | ICD-10-CM | POA: Diagnosis not present

## 2022-09-17 DIAGNOSIS — M6281 Muscle weakness (generalized): Secondary | ICD-10-CM | POA: Diagnosis not present

## 2022-09-17 DIAGNOSIS — M5489 Other dorsalgia: Secondary | ICD-10-CM | POA: Diagnosis not present

## 2022-09-17 DIAGNOSIS — M2569 Stiffness of other specified joint, not elsewhere classified: Secondary | ICD-10-CM | POA: Diagnosis not present

## 2022-09-20 DIAGNOSIS — M47816 Spondylosis without myelopathy or radiculopathy, lumbar region: Secondary | ICD-10-CM | POA: Diagnosis not present

## 2022-09-20 DIAGNOSIS — M4722 Other spondylosis with radiculopathy, cervical region: Secondary | ICD-10-CM | POA: Diagnosis not present

## 2022-09-24 DIAGNOSIS — R293 Abnormal posture: Secondary | ICD-10-CM | POA: Diagnosis not present

## 2022-09-24 DIAGNOSIS — M5489 Other dorsalgia: Secondary | ICD-10-CM | POA: Diagnosis not present

## 2022-09-24 DIAGNOSIS — M6281 Muscle weakness (generalized): Secondary | ICD-10-CM | POA: Diagnosis not present

## 2022-09-24 DIAGNOSIS — M2569 Stiffness of other specified joint, not elsewhere classified: Secondary | ICD-10-CM | POA: Diagnosis not present

## 2022-09-28 DIAGNOSIS — M2569 Stiffness of other specified joint, not elsewhere classified: Secondary | ICD-10-CM | POA: Diagnosis not present

## 2022-09-28 DIAGNOSIS — R293 Abnormal posture: Secondary | ICD-10-CM | POA: Diagnosis not present

## 2022-09-28 DIAGNOSIS — M6281 Muscle weakness (generalized): Secondary | ICD-10-CM | POA: Diagnosis not present

## 2022-09-28 DIAGNOSIS — M5489 Other dorsalgia: Secondary | ICD-10-CM | POA: Diagnosis not present

## 2022-10-01 DIAGNOSIS — M6281 Muscle weakness (generalized): Secondary | ICD-10-CM | POA: Diagnosis not present

## 2022-10-01 DIAGNOSIS — M5489 Other dorsalgia: Secondary | ICD-10-CM | POA: Diagnosis not present

## 2022-10-01 DIAGNOSIS — M2569 Stiffness of other specified joint, not elsewhere classified: Secondary | ICD-10-CM | POA: Diagnosis not present

## 2022-10-01 DIAGNOSIS — R293 Abnormal posture: Secondary | ICD-10-CM | POA: Diagnosis not present

## 2022-10-05 DIAGNOSIS — M5489 Other dorsalgia: Secondary | ICD-10-CM | POA: Diagnosis not present

## 2022-10-05 DIAGNOSIS — R293 Abnormal posture: Secondary | ICD-10-CM | POA: Diagnosis not present

## 2022-10-05 DIAGNOSIS — M6281 Muscle weakness (generalized): Secondary | ICD-10-CM | POA: Diagnosis not present

## 2022-10-05 DIAGNOSIS — M2569 Stiffness of other specified joint, not elsewhere classified: Secondary | ICD-10-CM | POA: Diagnosis not present

## 2022-10-08 DIAGNOSIS — R293 Abnormal posture: Secondary | ICD-10-CM | POA: Diagnosis not present

## 2022-10-08 DIAGNOSIS — M6281 Muscle weakness (generalized): Secondary | ICD-10-CM | POA: Diagnosis not present

## 2022-10-08 DIAGNOSIS — M2569 Stiffness of other specified joint, not elsewhere classified: Secondary | ICD-10-CM | POA: Diagnosis not present

## 2022-10-08 DIAGNOSIS — M5489 Other dorsalgia: Secondary | ICD-10-CM | POA: Diagnosis not present

## 2022-10-11 DIAGNOSIS — S20411S Abrasion of right back wall of thorax, sequela: Secondary | ICD-10-CM | POA: Diagnosis not present

## 2022-10-12 DIAGNOSIS — M6281 Muscle weakness (generalized): Secondary | ICD-10-CM | POA: Diagnosis not present

## 2022-10-12 DIAGNOSIS — M5489 Other dorsalgia: Secondary | ICD-10-CM | POA: Diagnosis not present

## 2022-10-12 DIAGNOSIS — M2569 Stiffness of other specified joint, not elsewhere classified: Secondary | ICD-10-CM | POA: Diagnosis not present

## 2022-10-12 DIAGNOSIS — R293 Abnormal posture: Secondary | ICD-10-CM | POA: Diagnosis not present

## 2022-10-15 DIAGNOSIS — M2569 Stiffness of other specified joint, not elsewhere classified: Secondary | ICD-10-CM | POA: Diagnosis not present

## 2022-10-15 DIAGNOSIS — M5489 Other dorsalgia: Secondary | ICD-10-CM | POA: Diagnosis not present

## 2022-10-15 DIAGNOSIS — M6281 Muscle weakness (generalized): Secondary | ICD-10-CM | POA: Diagnosis not present

## 2022-10-15 DIAGNOSIS — R293 Abnormal posture: Secondary | ICD-10-CM | POA: Diagnosis not present

## 2022-10-26 DIAGNOSIS — D709 Neutropenia, unspecified: Secondary | ICD-10-CM | POA: Diagnosis not present

## 2022-10-26 DIAGNOSIS — R7303 Prediabetes: Secondary | ICD-10-CM | POA: Diagnosis not present

## 2022-10-26 DIAGNOSIS — I10 Essential (primary) hypertension: Secondary | ICD-10-CM | POA: Diagnosis not present

## 2022-10-26 DIAGNOSIS — D72819 Decreased white blood cell count, unspecified: Secondary | ICD-10-CM | POA: Diagnosis not present

## 2022-10-26 DIAGNOSIS — Z125 Encounter for screening for malignant neoplasm of prostate: Secondary | ICD-10-CM | POA: Diagnosis not present

## 2022-10-26 DIAGNOSIS — Z0000 Encounter for general adult medical examination without abnormal findings: Secondary | ICD-10-CM | POA: Diagnosis not present

## 2022-10-26 DIAGNOSIS — E782 Mixed hyperlipidemia: Secondary | ICD-10-CM | POA: Diagnosis not present

## 2022-11-04 DIAGNOSIS — M25641 Stiffness of right hand, not elsewhere classified: Secondary | ICD-10-CM | POA: Diagnosis not present

## 2022-11-04 DIAGNOSIS — M79642 Pain in left hand: Secondary | ICD-10-CM | POA: Diagnosis not present

## 2022-11-04 DIAGNOSIS — M79641 Pain in right hand: Secondary | ICD-10-CM | POA: Diagnosis not present

## 2022-11-04 DIAGNOSIS — M25642 Stiffness of left hand, not elsewhere classified: Secondary | ICD-10-CM | POA: Diagnosis not present

## 2022-11-08 DIAGNOSIS — M79641 Pain in right hand: Secondary | ICD-10-CM | POA: Diagnosis not present

## 2022-11-08 DIAGNOSIS — M25642 Stiffness of left hand, not elsewhere classified: Secondary | ICD-10-CM | POA: Diagnosis not present

## 2022-11-08 DIAGNOSIS — M25641 Stiffness of right hand, not elsewhere classified: Secondary | ICD-10-CM | POA: Diagnosis not present

## 2022-11-08 DIAGNOSIS — M79642 Pain in left hand: Secondary | ICD-10-CM | POA: Diagnosis not present

## 2022-11-10 DIAGNOSIS — M79641 Pain in right hand: Secondary | ICD-10-CM | POA: Diagnosis not present

## 2022-11-10 DIAGNOSIS — M25641 Stiffness of right hand, not elsewhere classified: Secondary | ICD-10-CM | POA: Diagnosis not present

## 2022-11-10 DIAGNOSIS — M79642 Pain in left hand: Secondary | ICD-10-CM | POA: Diagnosis not present

## 2022-11-10 DIAGNOSIS — M25642 Stiffness of left hand, not elsewhere classified: Secondary | ICD-10-CM | POA: Diagnosis not present

## 2022-12-29 DIAGNOSIS — T63461A Toxic effect of venom of wasps, accidental (unintentional), initial encounter: Secondary | ICD-10-CM | POA: Diagnosis not present

## 2023-01-04 DIAGNOSIS — L570 Actinic keratosis: Secondary | ICD-10-CM | POA: Diagnosis not present

## 2023-01-04 DIAGNOSIS — D485 Neoplasm of uncertain behavior of skin: Secondary | ICD-10-CM | POA: Diagnosis not present

## 2023-01-04 DIAGNOSIS — D2262 Melanocytic nevi of left upper limb, including shoulder: Secondary | ICD-10-CM | POA: Diagnosis not present

## 2023-01-04 DIAGNOSIS — C44519 Basal cell carcinoma of skin of other part of trunk: Secondary | ICD-10-CM | POA: Diagnosis not present

## 2023-01-04 DIAGNOSIS — D225 Melanocytic nevi of trunk: Secondary | ICD-10-CM | POA: Diagnosis not present

## 2023-01-04 DIAGNOSIS — D2239 Melanocytic nevi of other parts of face: Secondary | ICD-10-CM | POA: Diagnosis not present

## 2023-01-04 DIAGNOSIS — D2261 Melanocytic nevi of right upper limb, including shoulder: Secondary | ICD-10-CM | POA: Diagnosis not present

## 2023-01-04 DIAGNOSIS — Z85828 Personal history of other malignant neoplasm of skin: Secondary | ICD-10-CM | POA: Diagnosis not present

## 2023-02-16 DIAGNOSIS — I10 Essential (primary) hypertension: Secondary | ICD-10-CM | POA: Diagnosis not present

## 2023-02-16 DIAGNOSIS — M6208 Separation of muscle (nontraumatic), other site: Secondary | ICD-10-CM | POA: Diagnosis not present

## 2023-02-16 DIAGNOSIS — R609 Edema, unspecified: Secondary | ICD-10-CM | POA: Diagnosis not present

## 2023-02-25 DIAGNOSIS — R293 Abnormal posture: Secondary | ICD-10-CM | POA: Diagnosis not present

## 2023-02-25 DIAGNOSIS — R109 Unspecified abdominal pain: Secondary | ICD-10-CM | POA: Diagnosis not present

## 2023-02-25 DIAGNOSIS — M6208 Separation of muscle (nontraumatic), other site: Secondary | ICD-10-CM | POA: Diagnosis not present

## 2023-02-25 DIAGNOSIS — M6281 Muscle weakness (generalized): Secondary | ICD-10-CM | POA: Diagnosis not present

## 2023-03-01 DIAGNOSIS — R293 Abnormal posture: Secondary | ICD-10-CM | POA: Diagnosis not present

## 2023-03-01 DIAGNOSIS — R109 Unspecified abdominal pain: Secondary | ICD-10-CM | POA: Diagnosis not present

## 2023-03-01 DIAGNOSIS — M6208 Separation of muscle (nontraumatic), other site: Secondary | ICD-10-CM | POA: Diagnosis not present

## 2023-03-01 DIAGNOSIS — M6281 Muscle weakness (generalized): Secondary | ICD-10-CM | POA: Diagnosis not present

## 2023-03-03 DIAGNOSIS — M6208 Separation of muscle (nontraumatic), other site: Secondary | ICD-10-CM | POA: Diagnosis not present

## 2023-03-03 DIAGNOSIS — R293 Abnormal posture: Secondary | ICD-10-CM | POA: Diagnosis not present

## 2023-03-03 DIAGNOSIS — M6281 Muscle weakness (generalized): Secondary | ICD-10-CM | POA: Diagnosis not present

## 2023-03-03 DIAGNOSIS — R109 Unspecified abdominal pain: Secondary | ICD-10-CM | POA: Diagnosis not present

## 2023-03-10 DIAGNOSIS — R109 Unspecified abdominal pain: Secondary | ICD-10-CM | POA: Diagnosis not present

## 2023-03-10 DIAGNOSIS — M6208 Separation of muscle (nontraumatic), other site: Secondary | ICD-10-CM | POA: Diagnosis not present

## 2023-03-10 DIAGNOSIS — M6281 Muscle weakness (generalized): Secondary | ICD-10-CM | POA: Diagnosis not present

## 2023-03-10 DIAGNOSIS — R293 Abnormal posture: Secondary | ICD-10-CM | POA: Diagnosis not present

## 2023-03-15 DIAGNOSIS — R293 Abnormal posture: Secondary | ICD-10-CM | POA: Diagnosis not present

## 2023-03-15 DIAGNOSIS — R109 Unspecified abdominal pain: Secondary | ICD-10-CM | POA: Diagnosis not present

## 2023-03-15 DIAGNOSIS — M6208 Separation of muscle (nontraumatic), other site: Secondary | ICD-10-CM | POA: Diagnosis not present

## 2023-03-15 DIAGNOSIS — M6281 Muscle weakness (generalized): Secondary | ICD-10-CM | POA: Diagnosis not present

## 2023-03-29 DIAGNOSIS — I10 Essential (primary) hypertension: Secondary | ICD-10-CM | POA: Diagnosis not present

## 2023-03-29 DIAGNOSIS — Z6829 Body mass index (BMI) 29.0-29.9, adult: Secondary | ICD-10-CM | POA: Diagnosis not present

## 2023-04-11 DIAGNOSIS — K080 Exfoliation of teeth due to systemic causes: Secondary | ICD-10-CM | POA: Diagnosis not present

## 2023-04-14 DIAGNOSIS — J40 Bronchitis, not specified as acute or chronic: Secondary | ICD-10-CM | POA: Diagnosis not present

## 2023-04-14 DIAGNOSIS — R062 Wheezing: Secondary | ICD-10-CM | POA: Diagnosis not present

## 2023-04-14 DIAGNOSIS — J0100 Acute maxillary sinusitis, unspecified: Secondary | ICD-10-CM | POA: Diagnosis not present

## 2023-04-14 DIAGNOSIS — R059 Cough, unspecified: Secondary | ICD-10-CM | POA: Diagnosis not present

## 2023-05-23 DIAGNOSIS — Z6829 Body mass index (BMI) 29.0-29.9, adult: Secondary | ICD-10-CM | POA: Diagnosis not present

## 2023-05-23 DIAGNOSIS — I10 Essential (primary) hypertension: Secondary | ICD-10-CM | POA: Diagnosis not present

## 2023-07-14 DIAGNOSIS — D225 Melanocytic nevi of trunk: Secondary | ICD-10-CM | POA: Diagnosis not present

## 2023-07-14 DIAGNOSIS — Z85828 Personal history of other malignant neoplasm of skin: Secondary | ICD-10-CM | POA: Diagnosis not present

## 2023-07-14 DIAGNOSIS — D2262 Melanocytic nevi of left upper limb, including shoulder: Secondary | ICD-10-CM | POA: Diagnosis not present

## 2023-07-14 DIAGNOSIS — L570 Actinic keratosis: Secondary | ICD-10-CM | POA: Diagnosis not present

## 2023-07-14 DIAGNOSIS — D2261 Melanocytic nevi of right upper limb, including shoulder: Secondary | ICD-10-CM | POA: Diagnosis not present

## 2023-07-20 DIAGNOSIS — M9902 Segmental and somatic dysfunction of thoracic region: Secondary | ICD-10-CM | POA: Diagnosis not present

## 2023-07-20 DIAGNOSIS — M9903 Segmental and somatic dysfunction of lumbar region: Secondary | ICD-10-CM | POA: Diagnosis not present

## 2023-07-20 DIAGNOSIS — M9904 Segmental and somatic dysfunction of sacral region: Secondary | ICD-10-CM | POA: Diagnosis not present

## 2023-07-20 DIAGNOSIS — M9901 Segmental and somatic dysfunction of cervical region: Secondary | ICD-10-CM | POA: Diagnosis not present

## 2023-07-25 DIAGNOSIS — M9902 Segmental and somatic dysfunction of thoracic region: Secondary | ICD-10-CM | POA: Diagnosis not present

## 2023-07-25 DIAGNOSIS — M9901 Segmental and somatic dysfunction of cervical region: Secondary | ICD-10-CM | POA: Diagnosis not present

## 2023-07-25 DIAGNOSIS — M9903 Segmental and somatic dysfunction of lumbar region: Secondary | ICD-10-CM | POA: Diagnosis not present

## 2023-07-25 DIAGNOSIS — M9904 Segmental and somatic dysfunction of sacral region: Secondary | ICD-10-CM | POA: Diagnosis not present

## 2023-07-27 DIAGNOSIS — B349 Viral infection, unspecified: Secondary | ICD-10-CM | POA: Diagnosis not present

## 2023-07-27 DIAGNOSIS — J101 Influenza due to other identified influenza virus with other respiratory manifestations: Secondary | ICD-10-CM | POA: Diagnosis not present

## 2023-07-27 DIAGNOSIS — U071 COVID-19: Secondary | ICD-10-CM | POA: Diagnosis not present

## 2023-07-29 DIAGNOSIS — N410 Acute prostatitis: Secondary | ICD-10-CM | POA: Diagnosis not present

## 2023-07-29 DIAGNOSIS — R3915 Urgency of urination: Secondary | ICD-10-CM | POA: Diagnosis not present

## 2023-08-02 DIAGNOSIS — M9901 Segmental and somatic dysfunction of cervical region: Secondary | ICD-10-CM | POA: Diagnosis not present

## 2023-08-02 DIAGNOSIS — M9902 Segmental and somatic dysfunction of thoracic region: Secondary | ICD-10-CM | POA: Diagnosis not present

## 2023-08-02 DIAGNOSIS — M9904 Segmental and somatic dysfunction of sacral region: Secondary | ICD-10-CM | POA: Diagnosis not present

## 2023-08-02 DIAGNOSIS — M9903 Segmental and somatic dysfunction of lumbar region: Secondary | ICD-10-CM | POA: Diagnosis not present

## 2023-08-16 DIAGNOSIS — M9901 Segmental and somatic dysfunction of cervical region: Secondary | ICD-10-CM | POA: Diagnosis not present

## 2023-08-16 DIAGNOSIS — M9903 Segmental and somatic dysfunction of lumbar region: Secondary | ICD-10-CM | POA: Diagnosis not present

## 2023-08-16 DIAGNOSIS — M9902 Segmental and somatic dysfunction of thoracic region: Secondary | ICD-10-CM | POA: Diagnosis not present

## 2023-08-16 DIAGNOSIS — M9904 Segmental and somatic dysfunction of sacral region: Secondary | ICD-10-CM | POA: Diagnosis not present

## 2023-08-29 DIAGNOSIS — M9902 Segmental and somatic dysfunction of thoracic region: Secondary | ICD-10-CM | POA: Diagnosis not present

## 2023-08-29 DIAGNOSIS — M9904 Segmental and somatic dysfunction of sacral region: Secondary | ICD-10-CM | POA: Diagnosis not present

## 2023-08-29 DIAGNOSIS — M9901 Segmental and somatic dysfunction of cervical region: Secondary | ICD-10-CM | POA: Diagnosis not present

## 2023-08-29 DIAGNOSIS — M9903 Segmental and somatic dysfunction of lumbar region: Secondary | ICD-10-CM | POA: Diagnosis not present

## 2023-09-06 DIAGNOSIS — M9904 Segmental and somatic dysfunction of sacral region: Secondary | ICD-10-CM | POA: Diagnosis not present

## 2023-09-06 DIAGNOSIS — M9902 Segmental and somatic dysfunction of thoracic region: Secondary | ICD-10-CM | POA: Diagnosis not present

## 2023-09-06 DIAGNOSIS — M9901 Segmental and somatic dysfunction of cervical region: Secondary | ICD-10-CM | POA: Diagnosis not present

## 2023-09-06 DIAGNOSIS — M9903 Segmental and somatic dysfunction of lumbar region: Secondary | ICD-10-CM | POA: Diagnosis not present

## 2023-09-21 DIAGNOSIS — M9903 Segmental and somatic dysfunction of lumbar region: Secondary | ICD-10-CM | POA: Diagnosis not present

## 2023-09-21 DIAGNOSIS — M9904 Segmental and somatic dysfunction of sacral region: Secondary | ICD-10-CM | POA: Diagnosis not present

## 2023-09-21 DIAGNOSIS — M9902 Segmental and somatic dysfunction of thoracic region: Secondary | ICD-10-CM | POA: Diagnosis not present

## 2023-09-21 DIAGNOSIS — M9901 Segmental and somatic dysfunction of cervical region: Secondary | ICD-10-CM | POA: Diagnosis not present

## 2023-09-28 DIAGNOSIS — M9903 Segmental and somatic dysfunction of lumbar region: Secondary | ICD-10-CM | POA: Diagnosis not present

## 2023-09-28 DIAGNOSIS — M9901 Segmental and somatic dysfunction of cervical region: Secondary | ICD-10-CM | POA: Diagnosis not present

## 2023-09-28 DIAGNOSIS — M9904 Segmental and somatic dysfunction of sacral region: Secondary | ICD-10-CM | POA: Diagnosis not present

## 2023-09-28 DIAGNOSIS — M9902 Segmental and somatic dysfunction of thoracic region: Secondary | ICD-10-CM | POA: Diagnosis not present

## 2023-10-03 DIAGNOSIS — M9904 Segmental and somatic dysfunction of sacral region: Secondary | ICD-10-CM | POA: Diagnosis not present

## 2023-10-03 DIAGNOSIS — M9903 Segmental and somatic dysfunction of lumbar region: Secondary | ICD-10-CM | POA: Diagnosis not present

## 2023-10-03 DIAGNOSIS — M9901 Segmental and somatic dysfunction of cervical region: Secondary | ICD-10-CM | POA: Diagnosis not present

## 2023-10-03 DIAGNOSIS — M9902 Segmental and somatic dysfunction of thoracic region: Secondary | ICD-10-CM | POA: Diagnosis not present

## 2023-10-06 DIAGNOSIS — M9901 Segmental and somatic dysfunction of cervical region: Secondary | ICD-10-CM | POA: Diagnosis not present

## 2023-10-06 DIAGNOSIS — M9902 Segmental and somatic dysfunction of thoracic region: Secondary | ICD-10-CM | POA: Diagnosis not present

## 2023-10-06 DIAGNOSIS — M9904 Segmental and somatic dysfunction of sacral region: Secondary | ICD-10-CM | POA: Diagnosis not present

## 2023-10-06 DIAGNOSIS — M9903 Segmental and somatic dysfunction of lumbar region: Secondary | ICD-10-CM | POA: Diagnosis not present

## 2023-10-12 DIAGNOSIS — M9901 Segmental and somatic dysfunction of cervical region: Secondary | ICD-10-CM | POA: Diagnosis not present

## 2023-10-12 DIAGNOSIS — M9904 Segmental and somatic dysfunction of sacral region: Secondary | ICD-10-CM | POA: Diagnosis not present

## 2023-10-12 DIAGNOSIS — M9903 Segmental and somatic dysfunction of lumbar region: Secondary | ICD-10-CM | POA: Diagnosis not present

## 2023-10-12 DIAGNOSIS — M9902 Segmental and somatic dysfunction of thoracic region: Secondary | ICD-10-CM | POA: Diagnosis not present

## 2023-10-19 DIAGNOSIS — M9903 Segmental and somatic dysfunction of lumbar region: Secondary | ICD-10-CM | POA: Diagnosis not present

## 2023-10-19 DIAGNOSIS — M9902 Segmental and somatic dysfunction of thoracic region: Secondary | ICD-10-CM | POA: Diagnosis not present

## 2023-10-19 DIAGNOSIS — M9904 Segmental and somatic dysfunction of sacral region: Secondary | ICD-10-CM | POA: Diagnosis not present

## 2023-10-19 DIAGNOSIS — M9901 Segmental and somatic dysfunction of cervical region: Secondary | ICD-10-CM | POA: Diagnosis not present

## 2023-10-26 DIAGNOSIS — M9903 Segmental and somatic dysfunction of lumbar region: Secondary | ICD-10-CM | POA: Diagnosis not present

## 2023-10-26 DIAGNOSIS — M9904 Segmental and somatic dysfunction of sacral region: Secondary | ICD-10-CM | POA: Diagnosis not present

## 2023-10-26 DIAGNOSIS — M9901 Segmental and somatic dysfunction of cervical region: Secondary | ICD-10-CM | POA: Diagnosis not present

## 2023-10-26 DIAGNOSIS — M9902 Segmental and somatic dysfunction of thoracic region: Secondary | ICD-10-CM | POA: Diagnosis not present

## 2023-11-02 DIAGNOSIS — M9902 Segmental and somatic dysfunction of thoracic region: Secondary | ICD-10-CM | POA: Diagnosis not present

## 2023-11-02 DIAGNOSIS — M9903 Segmental and somatic dysfunction of lumbar region: Secondary | ICD-10-CM | POA: Diagnosis not present

## 2023-11-02 DIAGNOSIS — M9901 Segmental and somatic dysfunction of cervical region: Secondary | ICD-10-CM | POA: Diagnosis not present

## 2023-11-02 DIAGNOSIS — M9904 Segmental and somatic dysfunction of sacral region: Secondary | ICD-10-CM | POA: Diagnosis not present

## 2023-11-09 DIAGNOSIS — M9903 Segmental and somatic dysfunction of lumbar region: Secondary | ICD-10-CM | POA: Diagnosis not present

## 2023-11-09 DIAGNOSIS — M9902 Segmental and somatic dysfunction of thoracic region: Secondary | ICD-10-CM | POA: Diagnosis not present

## 2023-11-09 DIAGNOSIS — M9901 Segmental and somatic dysfunction of cervical region: Secondary | ICD-10-CM | POA: Diagnosis not present

## 2023-11-09 DIAGNOSIS — M9904 Segmental and somatic dysfunction of sacral region: Secondary | ICD-10-CM | POA: Diagnosis not present

## 2023-11-16 DIAGNOSIS — M9902 Segmental and somatic dysfunction of thoracic region: Secondary | ICD-10-CM | POA: Diagnosis not present

## 2023-11-16 DIAGNOSIS — M9903 Segmental and somatic dysfunction of lumbar region: Secondary | ICD-10-CM | POA: Diagnosis not present

## 2023-11-16 DIAGNOSIS — M9904 Segmental and somatic dysfunction of sacral region: Secondary | ICD-10-CM | POA: Diagnosis not present

## 2023-11-16 DIAGNOSIS — M9901 Segmental and somatic dysfunction of cervical region: Secondary | ICD-10-CM | POA: Diagnosis not present

## 2023-11-18 DIAGNOSIS — Z125 Encounter for screening for malignant neoplasm of prostate: Secondary | ICD-10-CM | POA: Diagnosis not present

## 2023-11-18 DIAGNOSIS — Z0000 Encounter for general adult medical examination without abnormal findings: Secondary | ICD-10-CM | POA: Diagnosis not present

## 2023-11-18 DIAGNOSIS — I10 Essential (primary) hypertension: Secondary | ICD-10-CM | POA: Diagnosis not present

## 2023-11-18 DIAGNOSIS — D709 Neutropenia, unspecified: Secondary | ICD-10-CM | POA: Diagnosis not present

## 2023-11-18 DIAGNOSIS — E782 Mixed hyperlipidemia: Secondary | ICD-10-CM | POA: Diagnosis not present

## 2023-11-18 DIAGNOSIS — R7303 Prediabetes: Secondary | ICD-10-CM | POA: Diagnosis not present

## 2023-11-18 DIAGNOSIS — D72819 Decreased white blood cell count, unspecified: Secondary | ICD-10-CM | POA: Diagnosis not present

## 2023-11-18 DIAGNOSIS — E7800 Pure hypercholesterolemia, unspecified: Secondary | ICD-10-CM | POA: Diagnosis not present

## 2023-11-23 DIAGNOSIS — M9901 Segmental and somatic dysfunction of cervical region: Secondary | ICD-10-CM | POA: Diagnosis not present

## 2023-11-23 DIAGNOSIS — M9904 Segmental and somatic dysfunction of sacral region: Secondary | ICD-10-CM | POA: Diagnosis not present

## 2023-11-23 DIAGNOSIS — M9903 Segmental and somatic dysfunction of lumbar region: Secondary | ICD-10-CM | POA: Diagnosis not present

## 2023-11-23 DIAGNOSIS — M9902 Segmental and somatic dysfunction of thoracic region: Secondary | ICD-10-CM | POA: Diagnosis not present

## 2023-11-28 DIAGNOSIS — M9903 Segmental and somatic dysfunction of lumbar region: Secondary | ICD-10-CM | POA: Diagnosis not present

## 2023-11-28 DIAGNOSIS — M9902 Segmental and somatic dysfunction of thoracic region: Secondary | ICD-10-CM | POA: Diagnosis not present

## 2023-11-28 DIAGNOSIS — M9904 Segmental and somatic dysfunction of sacral region: Secondary | ICD-10-CM | POA: Diagnosis not present

## 2023-11-28 DIAGNOSIS — M9901 Segmental and somatic dysfunction of cervical region: Secondary | ICD-10-CM | POA: Diagnosis not present

## 2023-12-03 DIAGNOSIS — K1370 Unspecified lesions of oral mucosa: Secondary | ICD-10-CM | POA: Diagnosis not present

## 2023-12-15 DIAGNOSIS — M9902 Segmental and somatic dysfunction of thoracic region: Secondary | ICD-10-CM | POA: Diagnosis not present

## 2023-12-15 DIAGNOSIS — M9903 Segmental and somatic dysfunction of lumbar region: Secondary | ICD-10-CM | POA: Diagnosis not present

## 2023-12-15 DIAGNOSIS — M9904 Segmental and somatic dysfunction of sacral region: Secondary | ICD-10-CM | POA: Diagnosis not present

## 2023-12-15 DIAGNOSIS — M9901 Segmental and somatic dysfunction of cervical region: Secondary | ICD-10-CM | POA: Diagnosis not present

## 2023-12-16 DIAGNOSIS — R972 Elevated prostate specific antigen [PSA]: Secondary | ICD-10-CM | POA: Diagnosis not present

## 2023-12-22 DIAGNOSIS — M9902 Segmental and somatic dysfunction of thoracic region: Secondary | ICD-10-CM | POA: Diagnosis not present

## 2023-12-22 DIAGNOSIS — M9904 Segmental and somatic dysfunction of sacral region: Secondary | ICD-10-CM | POA: Diagnosis not present

## 2023-12-22 DIAGNOSIS — M9901 Segmental and somatic dysfunction of cervical region: Secondary | ICD-10-CM | POA: Diagnosis not present

## 2023-12-22 DIAGNOSIS — M9903 Segmental and somatic dysfunction of lumbar region: Secondary | ICD-10-CM | POA: Diagnosis not present

## 2023-12-27 DIAGNOSIS — R972 Elevated prostate specific antigen [PSA]: Secondary | ICD-10-CM | POA: Diagnosis not present

## 2023-12-29 DIAGNOSIS — M9904 Segmental and somatic dysfunction of sacral region: Secondary | ICD-10-CM | POA: Diagnosis not present

## 2023-12-29 DIAGNOSIS — M9901 Segmental and somatic dysfunction of cervical region: Secondary | ICD-10-CM | POA: Diagnosis not present

## 2023-12-29 DIAGNOSIS — M9902 Segmental and somatic dysfunction of thoracic region: Secondary | ICD-10-CM | POA: Diagnosis not present

## 2023-12-29 DIAGNOSIS — M9903 Segmental and somatic dysfunction of lumbar region: Secondary | ICD-10-CM | POA: Diagnosis not present

## 2024-01-05 DIAGNOSIS — M9901 Segmental and somatic dysfunction of cervical region: Secondary | ICD-10-CM | POA: Diagnosis not present

## 2024-01-05 DIAGNOSIS — M9903 Segmental and somatic dysfunction of lumbar region: Secondary | ICD-10-CM | POA: Diagnosis not present

## 2024-01-05 DIAGNOSIS — M9902 Segmental and somatic dysfunction of thoracic region: Secondary | ICD-10-CM | POA: Diagnosis not present

## 2024-01-05 DIAGNOSIS — M62830 Muscle spasm of back: Secondary | ICD-10-CM | POA: Diagnosis not present

## 2024-01-19 DIAGNOSIS — M62830 Muscle spasm of back: Secondary | ICD-10-CM | POA: Diagnosis not present

## 2024-01-19 DIAGNOSIS — M9902 Segmental and somatic dysfunction of thoracic region: Secondary | ICD-10-CM | POA: Diagnosis not present

## 2024-01-19 DIAGNOSIS — M9901 Segmental and somatic dysfunction of cervical region: Secondary | ICD-10-CM | POA: Diagnosis not present

## 2024-01-19 DIAGNOSIS — M9903 Segmental and somatic dysfunction of lumbar region: Secondary | ICD-10-CM | POA: Diagnosis not present

## 2024-01-30 DIAGNOSIS — M62830 Muscle spasm of back: Secondary | ICD-10-CM | POA: Diagnosis not present

## 2024-01-30 DIAGNOSIS — M9903 Segmental and somatic dysfunction of lumbar region: Secondary | ICD-10-CM | POA: Diagnosis not present

## 2024-01-30 DIAGNOSIS — M9902 Segmental and somatic dysfunction of thoracic region: Secondary | ICD-10-CM | POA: Diagnosis not present

## 2024-01-30 DIAGNOSIS — M9901 Segmental and somatic dysfunction of cervical region: Secondary | ICD-10-CM | POA: Diagnosis not present

## 2024-02-13 DIAGNOSIS — M9903 Segmental and somatic dysfunction of lumbar region: Secondary | ICD-10-CM | POA: Diagnosis not present

## 2024-02-13 DIAGNOSIS — M62830 Muscle spasm of back: Secondary | ICD-10-CM | POA: Diagnosis not present

## 2024-02-13 DIAGNOSIS — M9902 Segmental and somatic dysfunction of thoracic region: Secondary | ICD-10-CM | POA: Diagnosis not present

## 2024-02-13 DIAGNOSIS — M9901 Segmental and somatic dysfunction of cervical region: Secondary | ICD-10-CM | POA: Diagnosis not present

## 2024-02-14 DIAGNOSIS — D225 Melanocytic nevi of trunk: Secondary | ICD-10-CM | POA: Diagnosis not present

## 2024-02-14 DIAGNOSIS — Z85828 Personal history of other malignant neoplasm of skin: Secondary | ICD-10-CM | POA: Diagnosis not present

## 2024-02-14 DIAGNOSIS — L570 Actinic keratosis: Secondary | ICD-10-CM | POA: Diagnosis not present

## 2024-02-14 DIAGNOSIS — D2262 Melanocytic nevi of left upper limb, including shoulder: Secondary | ICD-10-CM | POA: Diagnosis not present

## 2024-02-14 DIAGNOSIS — D2261 Melanocytic nevi of right upper limb, including shoulder: Secondary | ICD-10-CM | POA: Diagnosis not present

## 2024-02-17 DIAGNOSIS — M7531 Calcific tendinitis of right shoulder: Secondary | ICD-10-CM | POA: Diagnosis not present

## 2024-02-21 DIAGNOSIS — C61 Malignant neoplasm of prostate: Secondary | ICD-10-CM | POA: Diagnosis not present

## 2024-02-21 DIAGNOSIS — N4289 Other specified disorders of prostate: Secondary | ICD-10-CM | POA: Diagnosis not present

## 2024-02-21 DIAGNOSIS — D075 Carcinoma in situ of prostate: Secondary | ICD-10-CM | POA: Diagnosis not present

## 2024-02-21 DIAGNOSIS — R972 Elevated prostate specific antigen [PSA]: Secondary | ICD-10-CM | POA: Diagnosis not present

## 2024-02-27 DIAGNOSIS — M62830 Muscle spasm of back: Secondary | ICD-10-CM | POA: Diagnosis not present

## 2024-02-27 DIAGNOSIS — M9901 Segmental and somatic dysfunction of cervical region: Secondary | ICD-10-CM | POA: Diagnosis not present

## 2024-02-27 DIAGNOSIS — M9902 Segmental and somatic dysfunction of thoracic region: Secondary | ICD-10-CM | POA: Diagnosis not present

## 2024-02-27 DIAGNOSIS — M9903 Segmental and somatic dysfunction of lumbar region: Secondary | ICD-10-CM | POA: Diagnosis not present

## 2024-02-28 DIAGNOSIS — C61 Malignant neoplasm of prostate: Secondary | ICD-10-CM | POA: Diagnosis not present

## 2024-02-28 DIAGNOSIS — R972 Elevated prostate specific antigen [PSA]: Secondary | ICD-10-CM | POA: Diagnosis not present

## 2024-03-19 DIAGNOSIS — M9903 Segmental and somatic dysfunction of lumbar region: Secondary | ICD-10-CM | POA: Diagnosis not present

## 2024-03-19 DIAGNOSIS — M62830 Muscle spasm of back: Secondary | ICD-10-CM | POA: Diagnosis not present

## 2024-03-19 DIAGNOSIS — M9901 Segmental and somatic dysfunction of cervical region: Secondary | ICD-10-CM | POA: Diagnosis not present

## 2024-03-19 DIAGNOSIS — M9902 Segmental and somatic dysfunction of thoracic region: Secondary | ICD-10-CM | POA: Diagnosis not present

## 2024-04-02 DIAGNOSIS — M9902 Segmental and somatic dysfunction of thoracic region: Secondary | ICD-10-CM | POA: Diagnosis not present

## 2024-04-02 DIAGNOSIS — M9901 Segmental and somatic dysfunction of cervical region: Secondary | ICD-10-CM | POA: Diagnosis not present

## 2024-04-02 DIAGNOSIS — M62830 Muscle spasm of back: Secondary | ICD-10-CM | POA: Diagnosis not present

## 2024-04-02 DIAGNOSIS — M9903 Segmental and somatic dysfunction of lumbar region: Secondary | ICD-10-CM | POA: Diagnosis not present

## 2024-04-04 DIAGNOSIS — K080 Exfoliation of teeth due to systemic causes: Secondary | ICD-10-CM | POA: Diagnosis not present

## 2024-04-16 DIAGNOSIS — M62830 Muscle spasm of back: Secondary | ICD-10-CM | POA: Diagnosis not present

## 2024-04-16 DIAGNOSIS — M9903 Segmental and somatic dysfunction of lumbar region: Secondary | ICD-10-CM | POA: Diagnosis not present

## 2024-04-16 DIAGNOSIS — M9901 Segmental and somatic dysfunction of cervical region: Secondary | ICD-10-CM | POA: Diagnosis not present

## 2024-04-16 DIAGNOSIS — M9902 Segmental and somatic dysfunction of thoracic region: Secondary | ICD-10-CM | POA: Diagnosis not present

## 2024-04-17 DIAGNOSIS — L239 Allergic contact dermatitis, unspecified cause: Secondary | ICD-10-CM | POA: Diagnosis not present

## 2024-04-23 DIAGNOSIS — M9903 Segmental and somatic dysfunction of lumbar region: Secondary | ICD-10-CM | POA: Diagnosis not present

## 2024-04-23 DIAGNOSIS — M9902 Segmental and somatic dysfunction of thoracic region: Secondary | ICD-10-CM | POA: Diagnosis not present

## 2024-04-23 DIAGNOSIS — M9901 Segmental and somatic dysfunction of cervical region: Secondary | ICD-10-CM | POA: Diagnosis not present

## 2024-04-23 DIAGNOSIS — M62830 Muscle spasm of back: Secondary | ICD-10-CM | POA: Diagnosis not present

## 2024-05-07 DIAGNOSIS — M9903 Segmental and somatic dysfunction of lumbar region: Secondary | ICD-10-CM | POA: Diagnosis not present

## 2024-05-07 DIAGNOSIS — M9901 Segmental and somatic dysfunction of cervical region: Secondary | ICD-10-CM | POA: Diagnosis not present

## 2024-05-07 DIAGNOSIS — M9902 Segmental and somatic dysfunction of thoracic region: Secondary | ICD-10-CM | POA: Diagnosis not present

## 2024-05-07 DIAGNOSIS — M62830 Muscle spasm of back: Secondary | ICD-10-CM | POA: Diagnosis not present
